# Patient Record
Sex: Male | Born: 1965 | Race: Black or African American | Hispanic: No | Marital: Married | Smoking: Never smoker
Health system: Southern US, Community
[De-identification: ages and names within clinical notes are randomized; demographics above are authoritative.]

## PROBLEM LIST (undated history)

## (undated) DIAGNOSIS — G4730 Sleep apnea, unspecified: Secondary | ICD-10-CM

## (undated) DIAGNOSIS — E7800 Pure hypercholesterolemia, unspecified: Secondary | ICD-10-CM

## (undated) DIAGNOSIS — E119 Type 2 diabetes mellitus without complications: Secondary | ICD-10-CM

## (undated) DIAGNOSIS — I10 Essential (primary) hypertension: Secondary | ICD-10-CM

## (undated) HISTORY — DX: Essential (primary) hypertension: I10

## (undated) HISTORY — DX: Pure hypercholesterolemia, unspecified: E78.00

## (undated) HISTORY — DX: Type 2 diabetes mellitus without complications: E11.9

---

## 2006-07-17 ENCOUNTER — Ambulatory Visit (HOSPITAL_COMMUNITY): Admission: RE | Admit: 2006-07-17 | Discharge: 2006-07-17 | Payer: Self-pay | Admitting: Family Medicine

## 2007-11-01 ENCOUNTER — Ambulatory Visit (HOSPITAL_COMMUNITY): Admission: RE | Admit: 2007-11-01 | Discharge: 2007-11-01 | Payer: Self-pay | Admitting: Family Medicine

## 2015-05-18 ENCOUNTER — Encounter (INDEPENDENT_AMBULATORY_CARE_PROVIDER_SITE_OTHER): Payer: Self-pay | Admitting: *Deleted

## 2015-06-24 ENCOUNTER — Other Ambulatory Visit (HOSPITAL_COMMUNITY): Payer: Self-pay | Admitting: Family Medicine

## 2015-06-24 ENCOUNTER — Ambulatory Visit (HOSPITAL_COMMUNITY)
Admission: RE | Admit: 2015-06-24 | Discharge: 2015-06-24 | Disposition: A | Discharge status: Home / Self Care | Payer: 59 | Source: Ambulatory Visit | Attending: Family Medicine | Admitting: Family Medicine

## 2015-06-24 DIAGNOSIS — M25571 Pain in right ankle and joints of right foot: Secondary | ICD-10-CM

## 2015-06-24 IMAGING — DX DG ANKLE COMPLETE 3+V*R*
3 series · 3 of 3 positions shown · non-contrast
Comparison: None.

CLINICAL DATA: Pain and swelling in the right ankle for 1 month.
Treated for gout. No reported injury.

EXAM:
RIGHT ANKLE - COMPLETE 3+ VIEW

[ankle ap]
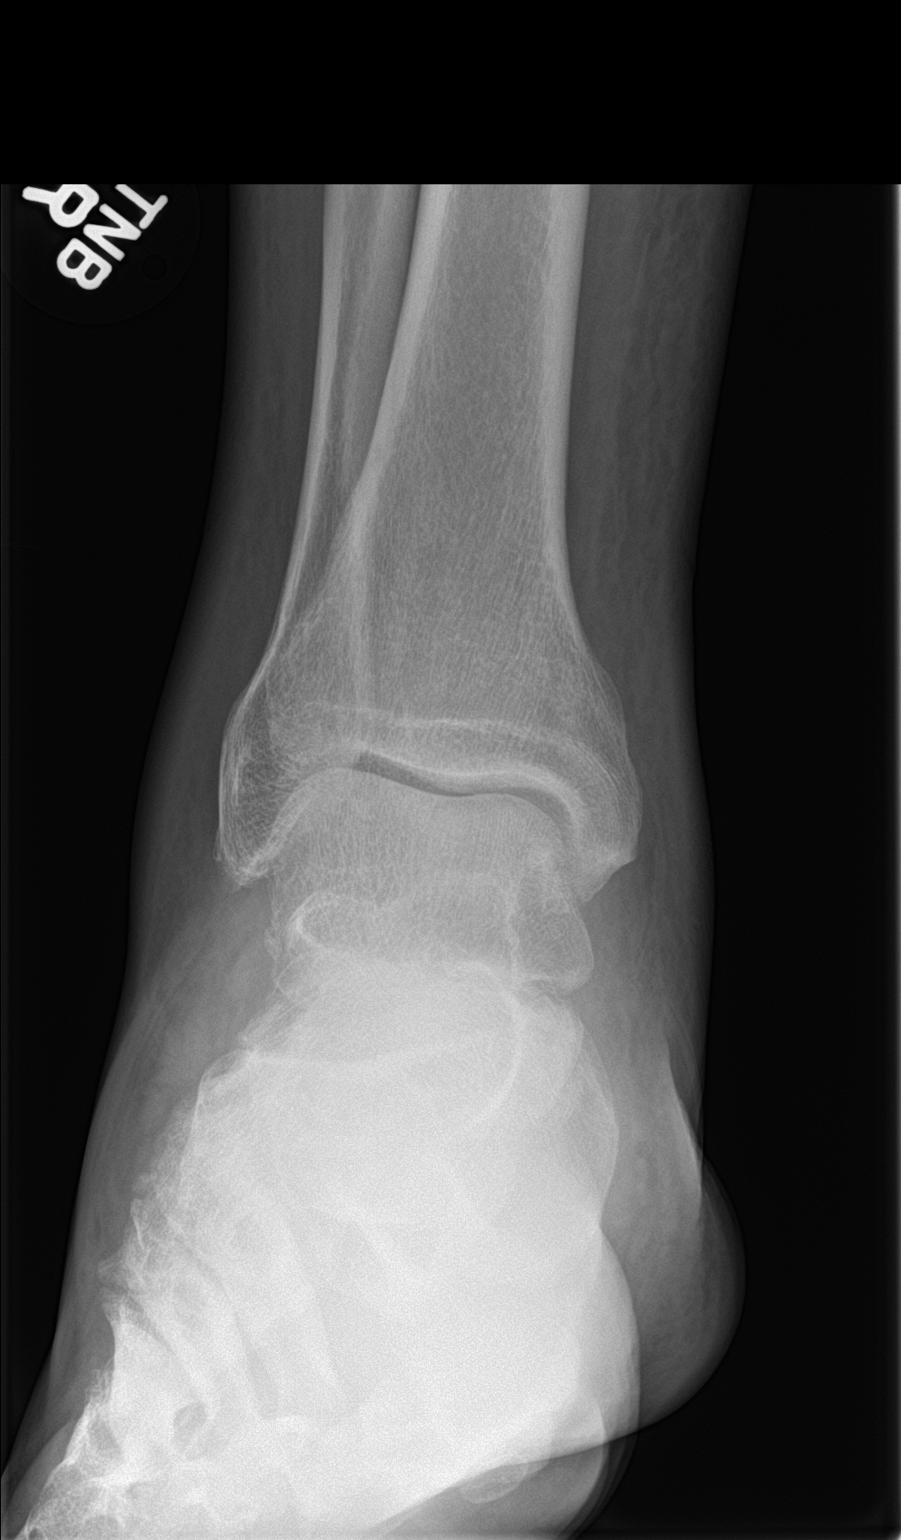

[ankle obl]
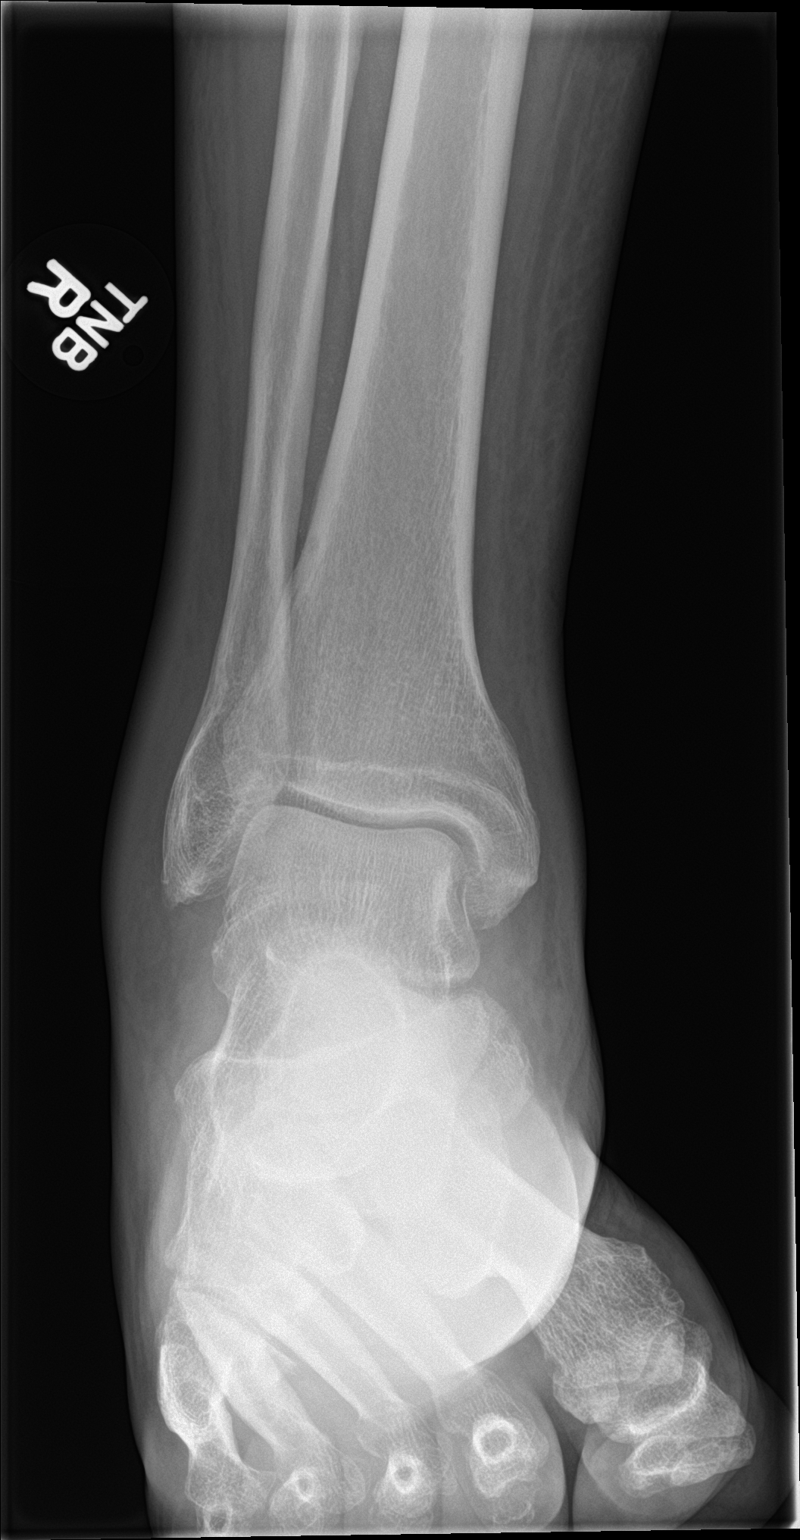

[ankle lat]
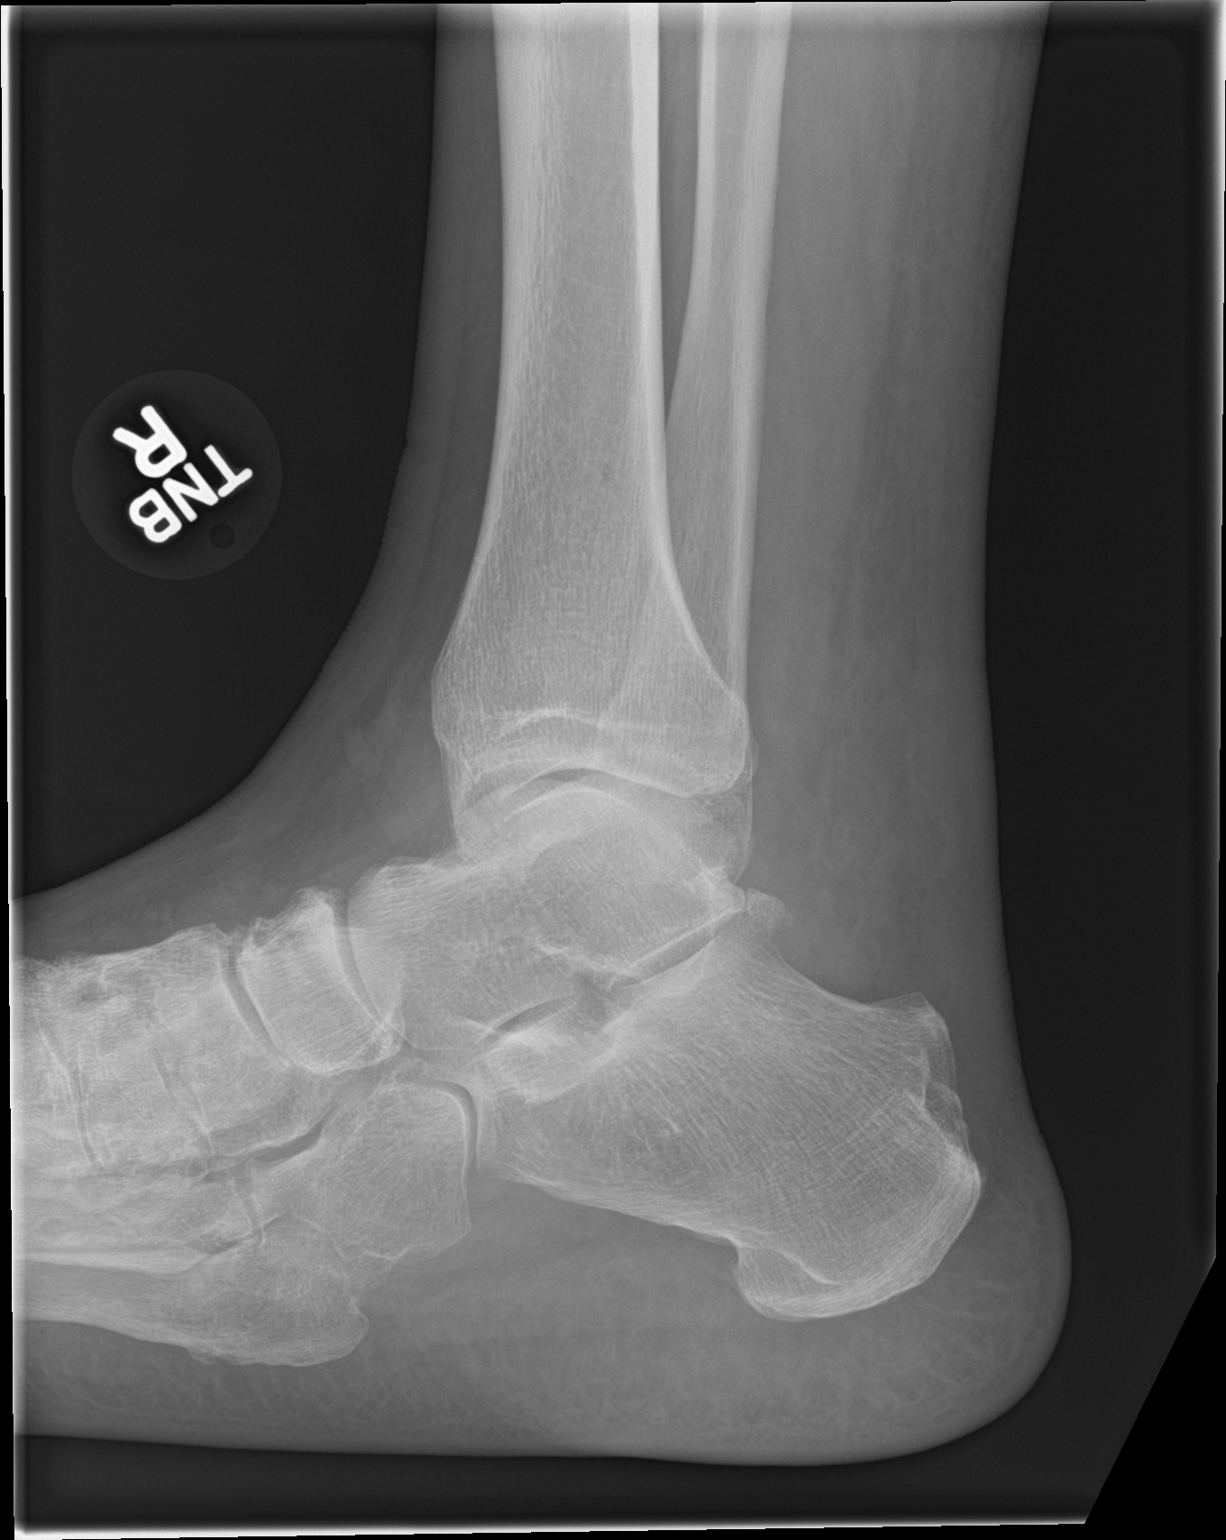

[3 of 3 positions shown; findings below may reference images not displayed]

FINDINGS: Moderate diffuse soft tissue swelling. No fracture, subluxation,
cortical erosions or suspicious focal osseous lesion. No appreciable
degenerative or erosive arthropathy. No pathologic soft tissue
calcifications.
IMPRESSION: Diffuse right ankle soft tissue swelling. No tophaceous soft tissue
calcifications. No cortical erosions in the right ankle.

## 2016-09-05 DIAGNOSIS — M109 Gout, unspecified: Secondary | ICD-10-CM | POA: Diagnosis not present

## 2016-09-05 DIAGNOSIS — E1165 Type 2 diabetes mellitus with hyperglycemia: Secondary | ICD-10-CM | POA: Diagnosis not present

## 2016-09-05 DIAGNOSIS — Z1389 Encounter for screening for other disorder: Secondary | ICD-10-CM | POA: Diagnosis not present

## 2016-09-21 DIAGNOSIS — E1165 Type 2 diabetes mellitus with hyperglycemia: Secondary | ICD-10-CM | POA: Diagnosis not present

## 2016-09-21 DIAGNOSIS — E782 Mixed hyperlipidemia: Secondary | ICD-10-CM | POA: Diagnosis not present

## 2016-09-21 DIAGNOSIS — I10 Essential (primary) hypertension: Secondary | ICD-10-CM | POA: Diagnosis not present

## 2016-12-12 DIAGNOSIS — E1165 Type 2 diabetes mellitus with hyperglycemia: Secondary | ICD-10-CM | POA: Diagnosis not present

## 2016-12-12 DIAGNOSIS — E782 Mixed hyperlipidemia: Secondary | ICD-10-CM | POA: Diagnosis not present

## 2016-12-26 DIAGNOSIS — R05 Cough: Secondary | ICD-10-CM | POA: Diagnosis not present

## 2017-10-05 DIAGNOSIS — E782 Mixed hyperlipidemia: Secondary | ICD-10-CM | POA: Diagnosis not present

## 2017-10-05 DIAGNOSIS — E1165 Type 2 diabetes mellitus with hyperglycemia: Secondary | ICD-10-CM | POA: Diagnosis not present

## 2017-10-05 DIAGNOSIS — M109 Gout, unspecified: Secondary | ICD-10-CM | POA: Diagnosis not present

## 2017-10-05 DIAGNOSIS — Z1389 Encounter for screening for other disorder: Secondary | ICD-10-CM | POA: Diagnosis not present

## 2018-01-16 DIAGNOSIS — E782 Mixed hyperlipidemia: Secondary | ICD-10-CM | POA: Diagnosis not present

## 2018-01-16 DIAGNOSIS — E119 Type 2 diabetes mellitus without complications: Secondary | ICD-10-CM | POA: Diagnosis not present

## 2018-01-16 DIAGNOSIS — R7989 Other specified abnormal findings of blood chemistry: Secondary | ICD-10-CM | POA: Diagnosis not present

## 2018-01-16 DIAGNOSIS — M109 Gout, unspecified: Secondary | ICD-10-CM | POA: Diagnosis not present

## 2019-05-04 ENCOUNTER — Ambulatory Visit: Payer: Self-pay | Attending: Internal Medicine

## 2019-05-04 DIAGNOSIS — Z23 Encounter for immunization: Secondary | ICD-10-CM

## 2019-05-04 NOTE — Progress Notes (Signed)
   Covid-19 Vaccination Clinic  Name:  Jesus Trevino    MRN: [id number] DOB: 03-Aug-1965  05/04/2019  [name] was observed post Covid-19 immunization for 15 minutes without incident. He was provided with Vaccine Information Sheet and instruction to access the V-Safe system.   [name] was instructed to call 911 with any severe reactions post vaccine: Marland Kitchen Difficulty breathing  . Swelling of face and throat  . A fast heartbeat  . A bad rash all over body  . Dizziness and weakness   Immunizations Administered    Name Date Dose VIS Date Route   Pfizer COVID-19 Vaccine 05/04/2019  6:10 PM 0.3 mL 02/07/2019 Intramuscular   Manufacturer: [place]   Lot: [id number]   [place]: [id number]

## 2019-05-25 ENCOUNTER — Ambulatory Visit: Payer: Self-pay | Attending: Internal Medicine

## 2019-05-25 DIAGNOSIS — Z23 Encounter for immunization: Secondary | ICD-10-CM

## 2019-05-25 NOTE — Progress Notes (Signed)
   Covid-19 Vaccination Clinic  Name:  Jesus Trevino    MRN: [id number] DOB: 11-28-1965  05/25/2019  Mr. [name] was observed post Covid-19 immunization for 15 minutes without incident. He was provided with Vaccine Information Sheet and instruction to access the V-Safe system.   Mr. [name] was instructed to call 911 with any severe reactions post vaccine: Marland Kitchen Difficulty breathing  . Swelling of face and throat  . A fast heartbeat  . A bad rash all over body  . Dizziness and weakness   Immunizations Administered    Name Date Dose VIS Date Route   Pfizer COVID-19 Vaccine 05/25/2019  1:30 PM 0.3 mL 02/07/2019 Intramuscular   Manufacturer: [place]   Lot: [id number]   [place]: [id number]

## 2020-04-27 ENCOUNTER — Encounter: Payer: Self-pay | Admitting: *Deleted

## 2020-05-26 ENCOUNTER — Encounter: Payer: Self-pay | Admitting: Nurse Practitioner

## 2020-05-26 ENCOUNTER — Other Ambulatory Visit: Payer: Self-pay

## 2020-05-26 ENCOUNTER — Ambulatory Visit (INDEPENDENT_AMBULATORY_CARE_PROVIDER_SITE_OTHER): Payer: Self-pay | Admitting: *Deleted

## 2020-05-26 VITALS — Ht 73.0 in | Wt 252.0 lb

## 2020-05-26 DIAGNOSIS — Z1211 Encounter for screening for malignant neoplasm of colon: Secondary | ICD-10-CM

## 2020-05-26 MED ORDER — CLENPIQ 10-3.5-12 MG-GM -GM/160ML PO SOLN: 1.0000 | Freq: Once | ORAL | 0 refills | Status: AC

## 2020-05-26 NOTE — Patient Instructions (Signed)
Covid test: 06/30/2020 at 3:40 PM.  [place]   Patient Name:  Jesus Trevino Date of procedure: 07/02/2020 Time to register at [place] Stay: You will receive a call from the hospital a few days before your procedure. Provider:  Dr. Abbey Chatters  Please notify us immediately if you are diabetic, take iron supplements, or if you are on coumadin or any blood thinners.  Please hold the following medications: See letter.  Note: Do NOT refrigerate or freeze CLENPIQ. CLENPIQ is ready to drink. There is no need to add any other liquid or mix the medicine in the bottle before you start dosing.   07/01/2020-  1 Day prior to procedure:     CLEAR LIQUIDS ALL DAY--NO SOLID FOODS OR DAIRY PRODUCTS! See list of liquids that are allowed and items that are NOT allowed below.   Diabetic Medication Instructions:  See letter.   You must drink plenty of CLEAR LIQUIDS starting before your bowel prep. It is important to stay adequately hydrated before, during, and after your bowel prep for the prep to work effectively!   At 4:00 PM Begin the prep as follows:    1. Drink one bottle of pre-mixed CLENPIQ right from the bottle.  2. Drink at least five (5) 8-ounce drinks of clear liquids of your choice within the next 5 hours   At 10:00 PM: 1. Drink the second bottle of pre-mixed CLENPIQ right from the bottle.   2. Drink at least three (3) 8-ounce drinks of clear liquids of your choice within the next 3 hours before going to bed.   Continue clear liquids until midnight.  Nothing by mouth after midnight.  07/02/2020-  Day of Procedure:   Diabetic medications adjustments: See letter.   You may take TYLENOL products.  Please continue your regular medications unless we have instructed you otherwise.     Please note, on the day of your procedure you MUST be accompanied by an adult who is willing to assume responsibility for you at time of discharge. If you do not have such person with you,  your procedure will have to be rescheduled.                                                                                                                     Please leave ALL jewelry at home prior to coming to the hospital for your procedure.   *It is your responsibility to check with your insurance company for the benefits of coverage you have for this procedure. Unfortunately, not all insurance companies have benefits to cover all or part of these types of procedures. It is your responsibility to check your benefits, however we will be glad to assist you with any codes your insurance company may need.   Please note that most insurance companies will not cover a screening colonoscopy for people under the age of 56  For example, with some insurance companies you may have benefits for a screening colonoscopy, but if polyps  are found the diagnosis will change and then you may have a deductible that will need to be met. Please make sure you check your benefits for screening colonoscopy as well as a diagnostic colonoscopy.    CLEAR LIQUIDS: (NO RED or PURPLE) Water  Jello   Apple Juice  White Grape Juice   Kool-Aid Soft drinks  Banana popsicles Sports Drink  Black coffee (No cream or milk) Tea (No cream or milk)  Broth (fat free beef/chicken/vegetable)  Clear liquids allow you to see your fingers on the other side of the glass.  Be sure they are NOT RED or PURPLE in color, cloudy, but CLEAR.  Do Not Eat: Dairy products of any kind Cranberry juice Tomato or V8 Juice  Orange Juice   Grapefruit Juice Red Grape Juice Alcohol   Non-dairy creamer Solid foods like cereal, oatmeal, yogurt, fruits, vegetables, creamed soups, eggs, bread, etc   HELPFUL HINTS TO MAKE DRINKING EASIER: -Trying drinking through a straw. -If you become nauseated, try consuming smaller amounts or stretch out the time between glasses.  Stop for 30 minutes & slowly start back drinking.  Call our office with any questions  or concerns at [phone].  Thank You,  Christ Kick, [place]

## 2020-05-26 NOTE — Progress Notes (Signed)
Gastroenterology Pre-Procedure Review  Request Date: 05/26/2020 Requesting Physician: Dr. Hilma Favors @ [facility], no previous TCS, family hx of colon polyps (mother)  PATIENT REVIEW QUESTIONS: The patient responded to the following health history questions as indicated:    1. Diabetes Melitis: yes, type II 2. Joint replacements in the past 12 months: no 3. Major health problems in the past 3 months: no 4. Has an artificial valve or MVP: no 5. Has a defibrillator: no 6. Has been advised in past to take antibiotics in advance of a procedure like teeth cleaning: no 7. Family history of colon cancer: no, but mother had colon polyps  8. Alcohol Use: no 9. Illicit drug Use: no 10. History of sleep apnea: yes, but not on CPAP  11. History of coronary artery or other vascular stents placed within the last 12 months: no 12. History of any prior anesthesia complications: no 13. Body mass index is 33.25 kg/m.    MEDICATIONS & ALLERGIES:    Patient reports the following regarding taking any blood thinners:   Plavix? no Aspirin? no Coumadin? no Brilinta? no Xarelto? no Eliquis? no Pradaxa? no Savaysa? no Effient? no  Patient confirms/reports the following medications:  Current Outpatient Medications  Medication Sig Dispense Refill  . metFORMIN (GLUCOPHAGE) 1000 MG tablet daily.    Marland Kitchen olmesartan (BENICAR) 40 MG tablet Take by mouth daily. Takes 1/2 tablet once daily.    . rosuvastatin (CRESTOR) 5 MG tablet Take 5 mg by mouth daily.     No current facility-administered medications for this visit.    Patient confirms/reports the following allergies:  No Known Allergies  No orders of the defined types were placed in this encounter.   AUTHORIZATION INFORMATION Primary Insurance: [facility],  [place] #:[id number] ,  Group #: [id number] Pre-Cert / Josem Kaufmann required: No, not required  SCHEDULE INFORMATION: Procedure has been scheduled as follows:  Date: 07/02/2020, Time: AM  procedure Location: [facility] with Dr. Abbey Chatters  This Gastroenterology Pre-Precedure Review Form is being routed to the following provider(s): Walden Field, NP

## 2020-05-26 NOTE — Progress Notes (Addendum)
Pt called me back when he got home to confirm his medications.  Added Glimepiride 2 mg x twice daily.

## 2020-05-26 NOTE — Progress Notes (Signed)
Ok to schedule.  DM meds: Glucophage: None the morning of  On prep day: Check CBG ac and hs as well (if they normally check their blood sugar) as if the patient feels like their blood sugar is off. Can use soda, juice (that's in [place]) as needed for any low blood sugar.  Check CBG on arrival to endo unit.  ASA I/II

## 2020-05-27 ENCOUNTER — Encounter: Payer: Self-pay | Admitting: *Deleted

## 2020-05-27 NOTE — Progress Notes (Signed)
Mailed letter to pt with diabetes medication adjustments.   

## 2020-06-30 ENCOUNTER — Other Ambulatory Visit: Payer: Self-pay

## 2020-06-30 ENCOUNTER — Other Ambulatory Visit (HOSPITAL_COMMUNITY)
Admission: RE | Admit: 2020-06-30 | Discharge: 2020-06-30 | Disposition: A | Discharge status: Home / Self Care | Payer: BC Managed Care – PPO | Source: Ambulatory Visit | Attending: Internal Medicine | Admitting: Internal Medicine

## 2020-06-30 DIAGNOSIS — D124 Benign neoplasm of descending colon: Secondary | ICD-10-CM | POA: Diagnosis not present

## 2020-06-30 DIAGNOSIS — Z79899 Other long term (current) drug therapy: Secondary | ICD-10-CM | POA: Diagnosis not present

## 2020-06-30 DIAGNOSIS — Z01812 Encounter for preprocedural laboratory examination: Secondary | ICD-10-CM | POA: Insufficient documentation

## 2020-06-30 DIAGNOSIS — Z7984 Long term (current) use of oral hypoglycemic drugs: Secondary | ICD-10-CM | POA: Diagnosis not present

## 2020-06-30 DIAGNOSIS — D123 Benign neoplasm of transverse colon: Secondary | ICD-10-CM | POA: Diagnosis not present

## 2020-06-30 DIAGNOSIS — Z20822 Contact with and (suspected) exposure to covid-19: Secondary | ICD-10-CM | POA: Insufficient documentation

## 2020-06-30 DIAGNOSIS — K648 Other hemorrhoids: Secondary | ICD-10-CM | POA: Diagnosis not present

## 2020-06-30 DIAGNOSIS — Z1211 Encounter for screening for malignant neoplasm of colon: Secondary | ICD-10-CM | POA: Diagnosis not present

## 2020-07-01 LAB — SARS CORONAVIRUS 2 (TAT 6-24 HRS): SARS Coronavirus 2: NEGATIVE

## 2020-07-02 ENCOUNTER — Other Ambulatory Visit: Payer: Self-pay

## 2020-07-02 ENCOUNTER — Encounter (HOSPITAL_COMMUNITY)
Admission: RE | Disposition: A | Discharge status: Home / Self Care | Payer: Self-pay | Source: Home / Self Care | Attending: Internal Medicine

## 2020-07-02 ENCOUNTER — Encounter (HOSPITAL_COMMUNITY): Payer: Self-pay

## 2020-07-02 ENCOUNTER — Ambulatory Visit (HOSPITAL_COMMUNITY)
Admission: RE | Admit: 2020-07-02 | Discharge: 2020-07-02 | Disposition: A | Discharge status: Home / Self Care | Payer: BC Managed Care – PPO | Attending: Internal Medicine | Admitting: Internal Medicine

## 2020-07-02 ENCOUNTER — Ambulatory Visit (HOSPITAL_COMMUNITY): Payer: BC Managed Care – PPO | Admitting: Anesthesiology

## 2020-07-02 DIAGNOSIS — Z1211 Encounter for screening for malignant neoplasm of colon: Secondary | ICD-10-CM | POA: Insufficient documentation

## 2020-07-02 DIAGNOSIS — K648 Other hemorrhoids: Secondary | ICD-10-CM | POA: Insufficient documentation

## 2020-07-02 DIAGNOSIS — K635 Polyp of colon: Secondary | ICD-10-CM | POA: Diagnosis not present

## 2020-07-02 DIAGNOSIS — D123 Benign neoplasm of transverse colon: Secondary | ICD-10-CM | POA: Insufficient documentation

## 2020-07-02 DIAGNOSIS — Z7984 Long term (current) use of oral hypoglycemic drugs: Secondary | ICD-10-CM | POA: Insufficient documentation

## 2020-07-02 DIAGNOSIS — D124 Benign neoplasm of descending colon: Secondary | ICD-10-CM | POA: Insufficient documentation

## 2020-07-02 DIAGNOSIS — Z20822 Contact with and (suspected) exposure to covid-19: Secondary | ICD-10-CM | POA: Insufficient documentation

## 2020-07-02 DIAGNOSIS — Z79899 Other long term (current) drug therapy: Secondary | ICD-10-CM | POA: Insufficient documentation

## 2020-07-02 HISTORY — PX: COLONOSCOPY WITH PROPOFOL: SHX5780

## 2020-07-02 HISTORY — PX: POLYPECTOMY: SHX5525

## 2020-07-02 LAB — GLUCOSE, CAPILLARY: Glucose-Capillary: 123 mg/dL — ABNORMAL HIGH (ref 70–99)

## 2020-07-02 SURGERY — COLONOSCOPY WITH PROPOFOL
Anesthesia: General

## 2020-07-02 MED ORDER — LIDOCAINE HCL (CARDIAC) PF 100 MG/5ML IV SOSY
PREFILLED_SYRINGE | INTRAVENOUS | Status: DC | PRN
  Administered 2020-07-02: 50 mg via INTRAVENOUS

## 2020-07-02 MED ORDER — CHLORHEXIDINE GLUCONATE CLOTH 2 % EX PADS: 6.0000 | MEDICATED_PAD | Freq: Once | CUTANEOUS | Status: DC

## 2020-07-02 MED ORDER — PROPOFOL 10 MG/ML IV BOLUS
INTRAVENOUS | Status: DC | PRN
  Administered 2020-07-02: 130 mg via INTRAVENOUS
  Administered 2020-07-02: 40 mg via INTRAVENOUS
  Administered 2020-07-02: 50 mg via INTRAVENOUS
  Administered 2020-07-02: 30 mg via INTRAVENOUS

## 2020-07-02 MED ORDER — LACTATED RINGERS IV SOLN: INTRAVENOUS | Status: DC

## 2020-07-02 NOTE — Op Note (Signed)
[facility] Patient Name: Jesus Trevino Procedure Date: 07/02/2020 9:11 AM MRN: [id number] Date of Birth: 08-09-1965 Attending MD: Elon Alas. Abbey Chatters DO CSN: [id number] Age: 55 Admit Type: Outpatient Procedure:                Colonoscopy Indications:              Screening for colorectal malignant neoplasm Providers:                Elon Alas. Abbey Chatters, DO, Gwenlyn Fudge, RN, Aram Candela Referring MD:              Medicines:                See the Anesthesia note for documentation of the                            administered medications Complications:            No immediate complications. Estimated Blood Loss:     Estimated blood loss was minimal. Procedure:                Pre-Anesthesia Assessment:                           - The anesthesia plan was to use monitored                            anesthesia care (MAC).                           After obtaining informed consent, the colonoscope                            was passed under direct vision. Throughout the                            procedure, the patient's blood pressure, pulse, and                            oxygen saturations were monitored continuously. The                            [id number]-HQ190L ([id number]) scope was introduced through                            the anus and advanced to the the cecum, identified                            by appendiceal orifice and ileocecal valve. The                            colonoscopy was performed without difficulty. The                            patient tolerated the procedure well. The quality  of the bowel preparation was evaluated using the                            BBPS [facility] Bowel Preparation Scale) with scores                            of: Right Colon = 3, Transverse Colon = 3 and Left                            Colon = 3 (entire mucosa seen well with no residual                            staining, small fragments  of stool or opaque                            liquid). The total BBPS score equals 9. Scope In: 9:22:02 AM Scope Out: 9:34:34 AM Scope Withdrawal Time: 0 hours 9 minutes 39 seconds  Total Procedure Duration: 0 hours 12 minutes 32 seconds  Findings:      The perianal and digital rectal examinations were normal.      Non-bleeding internal hemorrhoids were found during endoscopy.      A 6 mm polyp was found in the transverse colon. The polyp was sessile.       The polyp was removed with a cold snare. Resection and retrieval were       complete.      A 5 mm polyp was found in the descending colon. The polyp was sessile.       The polyp was removed with a hot snare. Resection and retrieval were       complete.      The exam was otherwise without abnormality. Impression:               - Non-bleeding internal hemorrhoids.                           - One 6 mm polyp in the transverse colon, removed                            with a cold snare. Resected and retrieved.                           - One 5 mm polyp in the descending colon, removed                            with a hot snare. Resected and retrieved.                           - The examination was otherwise normal. Moderate Sedation:      Per Anesthesia Care Recommendation:           - Patient has a contact number available for                            emergencies. The signs and symptoms of potential  delayed complications were discussed with the                            patient. Return to normal activities tomorrow.                            Written discharge instructions were provided to the                            patient.                           - Resume previous diet.                           - Continue present medications.                           - Await pathology results.                           - Repeat colonoscopy in 5 years for surveillance.                           - Return to GI  clinic PRN. Procedure Code(s):        --- Professional ---                           [phone], Colonoscopy, flexible; with removal of                            tumor(s), polyp(s), or other lesion(s) by snare                            technique Diagnosis Code(s):        --- Professional ---                           Z12.11, Encounter for screening for malignant                            neoplasm of colon                           K63.5, Polyp of colon                           K64.8, Other hemorrhoids CPT copyright 2019 American Medical Association. All rights reserved. The codes documented in this report are preliminary and upon coder review may  be revised to meet current compliance requirements. Elon Alas. Abbey Chatters, DO [place] Abbey Chatters, DO 07/02/2020 9:37:08 AM This report has been signed electronically. Number of Addenda: 0

## 2020-07-02 NOTE — Anesthesia Preprocedure Evaluation (Addendum)
Anesthesia Evaluation  Patient identified by MRN, date of birth, ID band Patient awake    Reviewed: Allergy & Precautions, NPO status , Patient's Chart, lab work & pertinent test results  History of Anesthesia Complications Negative for: history of anesthetic complications  Airway Mallampati: II  TM Distance: >3 FB Neck ROM: Full    Dental  (+) Dental Advisory Given, Chipped,    Pulmonary neg pulmonary ROS,    Pulmonary exam normal breath sounds clear to auscultation       Cardiovascular Exercise Tolerance: Good hypertension, Pt. on medications Normal cardiovascular exam Rhythm:Regular Rate:Normal     Neuro/Psych negative neurological ROS     GI/Hepatic negative GI ROS, Neg liver ROS,   Endo/Other  diabetes, Well Controlled, Type 2, Oral Hypoglycemic Agents  Renal/GU negative Renal ROS     Musculoskeletal negative musculoskeletal ROS (+)   Abdominal (+)  Abdomen: tender.  Tender umbilical hernia  Peds  Hematology negative hematology ROS (+)   Anesthesia Other Findings   Reproductive/Obstetrics negative OB ROS                           Anesthesia Physical Anesthesia Plan  ASA: II  Anesthesia Plan: General   Post-op Pain Management:    Induction: Intravenous  PONV Risk Score and Plan: Propofol infusion  Airway Management Planned: Nasal Cannula and Natural Airway  Additional Equipment:   Intra-op Plan:   Post-operative Plan:   Informed Consent: I have reviewed the patients History and Physical, chart, labs and discussed the procedure including the risks, benefits and alternatives for the proposed anesthesia with the patient or authorized representative who has indicated his/her understanding and acceptance.     Dental advisory given  Plan Discussed with: CRNA and Surgeon  Anesthesia Plan Comments:         Anesthesia Quick Evaluation

## 2020-07-02 NOTE — Transfer of Care (Signed)
Immediate Anesthesia Transfer of Care Note  Patient: Jesus Trevino  Procedure(s) Performed: COLONOSCOPY WITH PROPOFOL (N/A ) POLYPECTOMY  Patient Location: Endoscopy Unit  Anesthesia Type:General  Level of Consciousness: awake  Airway & Oxygen Therapy: Patient Spontanous Breathing  Post-op Assessment: Report given to RN and Post -op Vital signs reviewed and stable  Post vital signs: Reviewed and stable  Last Vitals:  Vitals Value Taken Time  BP 132/74 07/02/20 0937  Temp 36.9 C 07/02/20 0937  Pulse    Resp 13 07/02/20 0937  SpO2 98 % 07/02/20 0937    Last Pain:  Vitals:   07/02/20 0937  TempSrc: Oral  PainSc: 0-No pain      Patients Stated Pain Goal: 7 (35/70/17 7939)  Complications: No complications documented.

## 2020-07-02 NOTE — Anesthesia Procedure Notes (Signed)
Date/Time: 07/02/2020 9:24 AM Performed by: Orlie Dakin, CRNA Pre-anesthesia Checklist: Patient identified, Emergency Drugs available, Suction available and Patient being monitored Patient Re-evaluated:Patient Re-evaluated prior to induction Oxygen Delivery Method: Nasal cannula Induction Type: IV induction Placement Confirmation: positive ETCO2

## 2020-07-02 NOTE — H&P (Signed)
Primary Care Physician:  Sharilyn Sites, MD Primary Gastroenterologist:  Dr. Abbey Chatters  Pre-Procedure History & Physical: HPI:  Jesus Trevino is a 55 y.o. male is here for a colonoscopy for colon cancer screening purposes.  Patient denies any family history of colorectal cancer.  No melena or hematochezia.  No abdominal pain or unintentional weight loss.  No change in bowel habits.  Overall feels well from a GI standpoint.  Past Medical History:  Diagnosis Date  . Diabetes ([place])   . Hypercholesterolemia   . Hypertension     History reviewed. No pertinent surgical history.  Prior to Admission medications   Medication Sig Start Date End Date Taking? Authorizing Provider  glimepiride (AMARYL) 2 MG tablet Take 2 mg by mouth 2 (two) times daily. 04/20/20  Yes [provider]  metFORMIN (GLUCOPHAGE) 1000 MG tablet Take 1,000 mg by mouth daily. 04/20/20  Yes [provider]  olmesartan (BENICAR) 40 MG tablet Take 20 mg by mouth daily. Takes 1/2 tablet once daily. 04/10/20  Yes [provider]  rosuvastatin (CRESTOR) 5 MG tablet Take 5 mg by mouth daily. 04/10/20  Yes [provider]    Allergies as of 05/27/2020  . (No Known Allergies)    History reviewed. No pertinent family history.  Social History   Socioeconomic History  . Marital status: Married    Spouse name: Not on file  . Number of children: Not on file  . Years of education: Not on file  . Highest education level: Not on file  Occupational History  . Not on file  Tobacco Use  . Smoking status: Never Smoker  . Smokeless tobacco: Never Used  Vaping Use  . Vaping Use: Never used  Substance and Sexual Activity  . Alcohol use: Never  . Drug use: Never  . Sexual activity: Not on file  Other Topics Concern  . Not on file  Social History Narrative  . Not on file   Social Determinants of Health   Financial Resource Strain: Not on file  Food Insecurity: Not on file  Transportation  Needs: Not on file  Physical Activity: Not on file  Stress: Not on file  Social Connections: Not on file  Intimate Partner Violence: Not on file    Review of Systems: See HPI, otherwise negative ROS  Physical Exam: Vital signs in last 24 hours: Temp:  [98.5 F (36.9 C)] 98.5 F (36.9 C) (05/06 0757) Pulse Rate:  [111] 111 (05/06 0757) Resp:  [14] 14 (05/06 0757) BP: (131-196)/(98-118) 179/98 (05/06 0808) SpO2:  [97 %] 97 % (05/06 0757) Weight:  [114.3 kg] 114.3 kg (05/06 0748)   General:   Alert,  Well-developed, well-nourished, pleasant and cooperative in NAD Head:  Normocephalic and atraumatic. Eyes:  Sclera clear, no icterus.   Conjunctiva pink. Ears:  Normal auditory acuity. Nose:  No deformity, discharge,  or lesions. Mouth:  No deformity or lesions, dentition normal. Neck:  Supple; no masses or thyromegaly. Lungs:  Clear throughout to auscultation.   No wheezes, crackles, or rhonchi. No acute distress. Heart:  Regular rate and rhythm; no murmurs, clicks, rubs,  or gallops. Abdomen:  Soft, nontender and nondistended. No masses, hepatosplenomegaly or hernias noted. Normal bowel sounds, without guarding, and without rebound.   Msk:  Symmetrical without gross deformities. Normal posture. Extremities:  Without clubbing or edema. Neurologic:  Alert and  oriented x4;  grossly normal neurologically. Skin:  Intact without significant lesions or rashes. Cervical Nodes:  No significant cervical adenopathy.  Psych:  Alert and cooperative. Normal mood and affect.  Impression/Plan: Jesus Trevino is here for a colonoscopy to be performed for colon cancer screening purposes.  The risks of the procedure including infection, bleed, or perforation as well as benefits, limitations, alternatives and imponderables have been reviewed with the patient. Questions have been answered. All parties agreeable.

## 2020-07-02 NOTE — Anesthesia Postprocedure Evaluation (Signed)
Anesthesia Post Note  Patient: Jesus Trevino  Procedure(s) Performed: COLONOSCOPY WITH PROPOFOL (N/A ) POLYPECTOMY  Patient location during evaluation: PACU Anesthesia Type: General Level of consciousness: awake and alert and oriented Pain management: pain level controlled Vital Signs Assessment: post-procedure vital signs reviewed and stable Respiratory status: spontaneous breathing and respiratory function stable Cardiovascular status: blood pressure returned to baseline and stable Postop Assessment: no apparent nausea or vomiting Anesthetic complications: no   No complications documented.   Last Vitals:  Vitals:   07/02/20 0808 07/02/20 0937  BP: (!) 179/98 132/74  Pulse:    Resp:  13  Temp:  36.9 C  SpO2:  98%    Last Pain:  Vitals:   07/02/20 0937  TempSrc: Oral  PainSc: 0-No pain                 Joclynn Lumb C Dniya Neuhaus

## 2020-07-02 NOTE — Discharge Instructions (Addendum)
Colonoscopy Discharge Instructions  Read the instructions outlined below and refer to this sheet in the next few weeks. These discharge instructions provide you with general information on caring for yourself after you leave the hospital. Your doctor may also give you specific instructions. While your treatment has been planned according to the most current medical practices available, unavoidable complications occasionally occur.   ACTIVITY  You may resume your regular activity, but move at a slower pace for the next 24 hours.   Take frequent rest periods for the next 24 hours.   Walking will help get rid of the air and reduce the bloated feeling in your belly (abdomen).   No driving for 24 hours (because of the medicine (anesthesia) used during the test).    Do not sign any important legal documents or operate any machinery for 24 hours (because of the anesthesia used during the test).  NUTRITION  Drink plenty of fluids.   You may resume your normal diet as instructed by your doctor.   Begin with a light meal and progress to your normal diet. Heavy or fried foods are harder to digest and may make you feel sick to your stomach (nauseated).   Avoid alcoholic beverages for 24 hours or as instructed.  MEDICATIONS  You may resume your normal medications unless your doctor tells you otherwise.  WHAT YOU CAN EXPECT TODAY  Some feelings of bloating in the abdomen.   Passage of more gas than usual.   Spotting of blood in your stool or on the toilet paper.  IF YOU HAD POLYPS REMOVED DURING THE COLONOSCOPY:  No aspirin products for 7 days or as instructed.   No alcohol for 7 days or as instructed.   Eat a soft diet for the next 24 hours.  FINDING OUT THE RESULTS OF YOUR TEST Not all test results are available during your visit. If your test results are not back during the visit, make an appointment with your caregiver to find out the results. Do not assume everything is normal if  you have not heard from your caregiver or the medical facility. It is important for you to follow up on all of your test results.  SEEK IMMEDIATE MEDICAL ATTENTION IF:  You have more than a spotting of blood in your stool.   Your belly is swollen (abdominal distention).   You are nauseated or vomiting.   You have a temperature over 101.   You have abdominal pain or discomfort that is severe or gets worse throughout the day.   Your colonoscopy revealed 2 polyp(s) which I removed successfully. Await pathology results, my office will contact you. I recommend repeating colonoscopy in 5 years for surveillance purposes. Otherwise follow up with GI as needed.   I hope you have a great rest of your week!  Charles K. Carver, D.O. Gastroenterology and Hepatology [facility]    Colon Polyps  Colon polyps are tissue growths inside the colon, which is part of the large intestine. They are one of the types of polyps that can grow in the body. A polyp may be a round bump or a mushroom-shaped growth. You could have one polyp or more than one. Most colon polyps are noncancerous (benign). However, some colon polyps can become cancerous over time. Finding and removing the polyps early can help prevent this. What are the causes? The exact cause of colon polyps is not known. What increases the risk? The following factors may make you more likely to   this condition:  Having a family history of colorectal cancer or colon polyps.  Being older than 56 years of age.  Being younger than 56 years of age and having a significant family history of colorectal cancer or colon polyps or a genetic condition that puts you at higher risk of getting colon polyps.  Having inflammatory bowel disease, such as ulcerative colitis or Crohn's disease.  Having certain conditions passed from parent to child (hereditary conditions), such as: ? Familial adenomatous polyposis (FAP). ? Lynch  syndrome. ? Turcot syndrome. ? Peutz-Jeghers syndrome. ? MUTYH-associated polyposis (MAP).  Being overweight.  Certain lifestyle factors. These include smoking cigarettes, drinking too much alcohol, not getting enough exercise, and eating a diet that is high in fat and red meat and low in fiber.  Having had childhood cancer that was treated with radiation of the abdomen. What are the signs or symptoms? Many times, there are no symptoms. If you have symptoms, they may include:  Blood coming from the rectum during a bowel movement.  Blood in the stool (feces). The blood may be bright red or very dark in color.  Pain in the abdomen.  A change in bowel habits, such as constipation or diarrhea. How is this diagnosed? This condition is diagnosed with a colonoscopy. This is a procedure in which a lighted, flexible scope is inserted into the opening between the buttocks (anus) and then passed into the colon to examine the area. Polyps are sometimes found when a colonoscopy is done as part of routine cancer screening tests. How is this treated? This condition is treated by removing any polyps that are found. Most polyps can be removed during a colonoscopy. Those polyps will then be tested for cancer. Additional treatment may be needed depending on the results of testing. Follow these instructions at home: Eating and drinking  Eat foods that are high in fiber, such as fruits, vegetables, and whole grains.  Eat foods that are high in calcium and vitamin D, such as milk, cheese, yogurt, eggs, liver, fish, and broccoli.  Limit foods that are high in fat, such as fried foods and desserts.  Limit the amount of red meat, precooked or cured meat, or other processed meat that you eat, such as hot dogs, sausages, bacon, or meat loaves.  Limit sugary drinks.   Lifestyle  Maintain a healthy weight, or lose weight if recommended by your health care provider.  Exercise every day or as told by your  health care provider.  Do not use any products that contain nicotine or tobacco, such as cigarettes, e-cigarettes, and chewing tobacco. If you need help quitting, ask your health care provider.  Do not drink alcohol if: ? Your health care provider tells you not to drink. ? You are pregnant, may be pregnant, or are planning to become pregnant.  If you drink alcohol: ? Limit how much you use to:  0-1 drink a day for women.  0-2 drinks a day for men. ? Know how much alcohol is in your drink. In the [place] one drink equals one 12 oz bottle of beer (355 mL), one 5 oz glass of wine (148 mL), or one 1 oz glass of hard liquor (44 mL). General instructions  Take over-the-counter and prescription medicines only as told by your health care provider.  Keep all follow-up visits. This is important. This includes having regularly scheduled colonoscopies. Talk to your health care provider about when you need a colonoscopy. Contact a health care provider if:  You have new or worsening bleeding during a bowel movement.  You have new or increased blood in your stool.  You have a change in bowel habits.  You lose weight for no known reason. Summary  Colon polyps are tissue growths inside the colon, which is part of the large intestine. They are one type of polyp that can grow in the body.  Most colon polyps are noncancerous (benign), but some can become cancerous over time.  This condition is diagnosed with a colonoscopy.  This condition is treated by removing any polyps that are found. Most polyps can be removed during a colonoscopy. This information is not intended to replace advice given to you by your health care provider. Make sure you discuss any questions you have with your health care provider. Document Revised: 06/04/2019 Document Reviewed: 06/04/2019 Elsevier Patient Education  2021 [employer].

## 2020-07-05 LAB — SURGICAL PATHOLOGY

## 2020-07-09 ENCOUNTER — Encounter (HOSPITAL_COMMUNITY): Payer: Self-pay | Admitting: Internal Medicine

## 2020-08-06 NOTE — Progress Notes (Signed)
Recall placed

## 2020-08-11 ENCOUNTER — Other Ambulatory Visit: Payer: Self-pay | Admitting: Family Medicine

## 2020-08-11 DIAGNOSIS — K429 Umbilical hernia without obstruction or gangrene: Secondary | ICD-10-CM

## 2020-08-11 NOTE — Progress Notes (Signed)
ref

## 2020-08-24 ENCOUNTER — Ambulatory Visit: Payer: BC Managed Care – PPO | Admitting: General Surgery

## 2020-08-24 ENCOUNTER — Other Ambulatory Visit: Payer: Self-pay

## 2020-08-24 ENCOUNTER — Encounter: Payer: Self-pay | Admitting: General Surgery

## 2020-08-24 VITALS — BP 161/89 | HR 66 | Temp 98.1°F | Ht 73.0 in | Wt 257.2 lb

## 2020-08-24 DIAGNOSIS — K420 Umbilical hernia with obstruction, without gangrene: Secondary | ICD-10-CM

## 2020-08-24 NOTE — Progress Notes (Signed)
[facility] History and Physical  Reason for Referral: Umbilical hernia  Referring Physician: Sharilyn Sites, MD   Chief Complaint   New Patient (Initial Visit)     Jesus Trevino is a 55 y.o. male.  HPI: Mr. Jesus Trevino is a 55 yo who had a umbilical hernia for 5-6 years. It has gotten larger and is tender to him. He was noted to have this during his recent colonoscopy and they recommended that he come and get evaluated for repair. He says that he has pain with pushing the hernia in and it is 10/10 pain. He denise any obstructive type symptoms, but says that it does not want anyone to push on the hernia.  He otherwise says it does not bother him. He denies any skin changes or redness.   Past Medical History:  Diagnosis Date   Diabetes ([place])    Hypercholesterolemia    Hypertension     Past Surgical History:  Procedure Laterality Date   COLONOSCOPY WITH PROPOFOL N/A 07/02/2020   Procedure: COLONOSCOPY WITH PROPOFOL;  Surgeon: Eloise Harman, DO;  Location: [facility];  Service: Endoscopy;  Laterality: N/A;  ASA I / II / AM procedure   POLYPECTOMY  07/02/2020   Procedure: POLYPECTOMY;  Surgeon: Eloise Harman, DO;  Location: [facility];  Service: Endoscopy;;    Family History  Problem Relation Age of Onset   Asthma Mother    Alcohol abuse Father    Diabetes Sister     Social History   Tobacco Use   Smoking status: Never   Smokeless tobacco: Never  Vaping Use   Vaping Use: Never used  Substance Use Topics   Alcohol use: Never   Drug use: Never    Medications: I have reviewed the patient's current medications. Allergies as of 08/24/2020   No Known Allergies      Medication List        Accurate as of August 24, 2020 11:59 PM. If you have any questions, ask your nurse or doctor.          glimepiride 2 MG tablet Commonly known as: AMARYL Take 2 mg by mouth 2 (two) times daily.   metFORMIN 1000 MG tablet Commonly known as:  GLUCOPHAGE Take 1,000 mg by mouth daily.   olmesartan 40 MG tablet Commonly known as: BENICAR Take 20 mg by mouth daily. Takes 1/2 tablet once daily.   rosuvastatin 5 MG tablet Commonly known as: CRESTOR Take 5 mg by mouth daily.         ROS:  A comprehensive review of systems was negative except for: Cardiovascular: positive for HTN Gastrointestinal: positive for hernia and pain with palpation  Blood pressure (!) 161/89, pulse 66, temperature 98.1 F (36.7 C), temperature source Oral, height 6\' 1"  (1.854 m), weight 257 lb 3.2 oz (116.7 kg), SpO2 97 %. Physical Exam Vitals reviewed.  Constitutional:      Appearance: Normal appearance.  HENT:     Head: Normocephalic.     Nose: Nose normal.     Mouth/Throat:     Mouth: Mucous membranes are moist.  Eyes:     Extraocular Movements: Extraocular movements intact.  Cardiovascular:     Rate and Rhythm: Normal rate and regular rhythm.  Pulmonary:     Effort: Pulmonary effort is normal.     Breath sounds: Normal breath sounds.  Abdominal:     General: There is no distension.     Palpations: Abdomen is soft.  Tenderness: There is abdominal tenderness in the periumbilical area.     Hernia: A hernia is present. Hernia is present in the umbilical area.     Comments: Unable to reduce hernia because patient too tender to allow me to reduce it  Musculoskeletal:        General: Normal range of motion.     Cervical back: Normal range of motion.  Skin:    General: Skin is warm.  Neurological:     General: No focal deficit present.     Mental Status: He is alert and oriented to person, place, and time.  Psychiatric:        Mood and Affect: Mood normal.        Behavior: Behavior normal.        Thought Content: Thought content normal.        Judgment: Judgment normal.    Results: None    Assessment & Plan:  Jesus Trevino is a 55 y.o. male with an umbilical hernia that I cannot get reduced. Discussed with him the  importance or repair and reasons to go the hospital if he has any signs of strangulation. Discussed repair with mesh and risk of bleeding, infection, recurrence, and injury to bowel.   -He is going to call once he decides he wants it repaired.   All questions were answered to the satisfaction of the patient.    Virl Cagey 08/31/2020, 10:18 AM

## 2020-08-24 NOTE — Patient Instructions (Signed)
Open Hernia Repair, Adult °Open hernia repair is a surgical procedure to fix a hernia. A hernia occurs when an internal organ or tissue pushes through a weak spot in the muscles along the wall of the abdomen. Hernias commonly occur in the groin and around the belly button. °Most hernias tend to get worse over time. Often, surgery is done to prevent the hernia from becoming bigger, uncomfortable, or an emergency. Emergency surgery may be needed if contents of the abdomen get stuck in the opening (incarcerated hernia) or if the blood supply gets cut off (strangulated hernia). In an open repair, an incision is made in the abdomen to perform the surgery. °Tell a health care provider about: °Any allergies you have. °All medicines you are taking, including vitamins, herbs, eye drops, creams, and over-the-counter medicines. °Any problems you or family members have had with anesthetic medicines. °Any blood or bone disorders you have. °Any surgeries you have had. °Any medical conditions you have, including any recent cold or flu (influenza)symptoms. °Whether you are pregnant or may be pregnant. °What are the risks? °Generally, this is a safe procedure. However, problems may occur, including: °Long-lasting (chronic) pain. °Bleeding. °Infection. °Damage to the testicles. This can cause shrinking or swelling. °Damage to nearby structures or organs, including the bladder, blood vessels, intestines, or nerves near the hernia. °Blood clots. °Trouble passing urine. °Return of the hernia. °What happens before the procedure? °Medicines °Ask your health care provider about: °Changing or stopping your regular medicines. This is especially important if you are taking diabetes medicines or blood thinners. °Taking medicines such as aspirin and ibuprofen. These medicines can thin your blood. Do not take these medicines unless your health care provider tells you to take them. °Taking over-the-counter medicines, vitamins, herbs, and  supplements. °Surgery safety °Ask your health care provider: °How your surgery site will be marked. °What steps will be taken to help prevent infection. These steps may include: °Removing hair at the surgery site. °Washing skin with a germ-killing soap. °Receiving antibiotic medicine. °General instructions °You may have an exam or testing, such as blood tests or imaging studies. °Do not use any products that contain nicotine or tobacco for at least 4 weeks before the procedure. These products include cigarettes, chewing tobacco, and vaping devices, such as e-cigarettes. If you need help quitting, ask your health care provider. °Let your health care provider know if you develop a cold or any infection before your surgery. If you get an infection before surgery, you may receive antibiotics to treat it. °Plan to have a responsible adult take you home from the hospital or clinic. °If you will be going home right after the procedure, plan to have a responsible adult care for you for the time you are told. This is important. °What happens during the procedure? ° °An IV will be inserted into one of your veins. °You will be given one or more of the following: °A medicine to help you relax (sedative). °A medicine to numb the area (local anesthetic). °A medicine to make you fall asleep (general anesthetic). °Your surgeon will make an incision over the hernia. °The tissues of the hernia will be moved back into place. °The edges of the hernia may be stitched (sutured) together. °The opening in the abdominal muscles will be closed with stitches (sutures). Or, your surgeon will place a mesh patch made of artificial (synthetic) material over the opening. °The incision will be closed with sutures, skin glue, or adhesive strips. °A bandage (dressing) may be   placed over the incision. °The procedure may vary among health care providers and hospitals. °What happens after the procedure? °Your blood pressure, heart rate, breathing rate,  and blood oxygen level will be monitored until you leave the hospital or clinic. °You may be given medicine for pain. °If you were given a sedative during the procedure, it can affect you for several hours. Do not drive or operate machinery until your health care provider says that it is safe. °Summary °Open hernia repair is a surgical procedure to fix a hernia. Hernias commonly occur in the groin and around the belly button. °Emergency surgery may be needed if contents of the abdomen get stuck in the opening (incarcerated hernia) or if the blood supply gets cut off (strangulated hernia). °In this procedure, an incision is made in the abdomen to perform the surgery. °After the procedure, you may be given medicine for pain. °This information is not intended to replace advice given to you by your health care provider. Make sure you discuss any questions you have with your health care provider. °Document Revised: 09/29/2019 Document Reviewed: 09/29/2019 °Elsevier Patient Education © 2022 Elsevier Inc. ° °

## 2020-08-31 ENCOUNTER — Encounter: Payer: Self-pay | Admitting: General Surgery

## 2020-08-31 NOTE — H&P (Signed)
[facility] History and Physical   Reason for Referral: Umbilical hernia Referring Physician: Sharilyn Sites, MD     Chief Complaint   New Patient (Initial Visit)        Jesus Trevino is a 55 y.o. male. HPI: Jesus Trevino is a 55 yo who had a umbilical hernia for 5-6 years. It has gotten larger and is tender to him. He was noted to have this during his recent colonoscopy and they recommended that he come and get evaluated for repair. He says that he has pain with pushing the hernia in and it is 10/10 pain. He denise any obstructive type symptoms, but says that it does not want anyone to push on the hernia.  He otherwise says it does not bother him. He denies any skin changes or redness.       Past Medical History:  Diagnosis Date   Diabetes ([place])     Hypercholesterolemia     Hypertension             Past Surgical History:  Procedure Laterality Date   COLONOSCOPY WITH PROPOFOL N/A 07/02/2020    Procedure: COLONOSCOPY WITH PROPOFOL;  Surgeon: Eloise Harman, DO;  [facility];  Service: Endoscopy;  Laterality: N/A;  ASA I / II / AM procedure   POLYPECTOMY   07/02/2020    Procedure: POLYPECTOMY;  Surgeon: Eloise Harman, DO;  Location: [facility];  Service: Endoscopy;;           Family History  Problem Relation Age of Onset   Asthma Mother     Alcohol abuse Father     Diabetes Sister        Social History        Tobacco Use   Smoking status: Never   Smokeless tobacco: Never  Vaping Use   Vaping Use: Never used  Substance Use Topics   Alcohol use: Never   Drug use: Never      Medications: I have reviewed the patient's current medications. Allergies as of 08/24/2020   No Known Allergies         Medication List           Accurate as of August 24, 2020 11:59 PM. If you have any questions, ask your nurse or doctor.              glimepiride 2 MG tablet Commonly known as: AMARYL Take 2 mg by mouth 2 (two) times daily.     metFORMIN 1000 MG tablet Commonly known as: GLUCOPHAGE Take 1,000 mg by mouth daily.    olmesartan 40 MG tablet Commonly known as: BENICAR Take 20 mg by mouth daily. Takes 1/2 tablet once daily.    rosuvastatin 5 MG tablet Commonly known as: CRESTOR Take 5 mg by mouth daily.               ROS:  A comprehensive review of systems was negative except for: Cardiovascular: positive for HTN Gastrointestinal: positive for hernia and pain with palpation   Blood pressure (!) 161/89, pulse 66, temperature 98.1 F (36.7 C), temperature source Oral, height 6\' 1"  (1.854 m), weight 257 lb 3.2 oz (116.7 kg), SpO2 97 %. Physical Exam Vitals reviewed. Constitutional:      Appearance: Normal appearance. HENT:    Head: Normocephalic.    Nose: Nose normal.    Mouth/Throat:    Mouth: Mucous membranes are moist. Eyes:    Extraocular Movements: Extraocular movements intact. Cardiovascular:  Rate and Rhythm: Normal rate and regular rhythm. Pulmonary:    Effort: Pulmonary effort is normal.    Breath sounds: Normal breath sounds. Abdominal:    General: There is no distension.    Palpations: Abdomen is soft.    Tenderness: There is abdominal tenderness in the periumbilical area.    Hernia: A hernia is present. Hernia is present in the umbilical area.    Comments: Unable to reduce hernia because patient too tender to allow me to reduce it  Musculoskeletal:        General: Normal range of motion.    Cervical back: Normal range of motion. Skin:    General: Skin is warm. Neurological:    General: No focal deficit present.    Mental Status: He is alert and oriented to person, place, and time. Psychiatric:        Mood and Affect: Mood normal.        Behavior: Behavior normal.        Thought Content: Thought content normal.        Judgment: Judgment normal.     Results: None      Assessment & Plan:  Jesus Trevino is a 55 y.o. male with an umbilical hernia that I cannot get  reduced. Discussed with him the importance or repair and reasons to go the hospital if he has any signs of strangulation. Discussed repair with mesh and risk of bleeding, infection, recurrence, and injury to bowel.    -He is going to call once he decides he wants it repaired.    All questions were answered to the satisfaction of the patient.       Jesus Trevino 08/31/2020, 10:18 AM

## 2020-09-14 NOTE — Patient Instructions (Signed)
BENANCIO OSMUNDSON  09/14/2020     @PREFPERIOPPHARMACY @   Your procedure is scheduled on  09/17/2020.   Report to [facility] at   1130  A.M.   Call this number if you have problems the morning of surgery:  [phone]   Remember:  Do not eat or drink after midnight.    DO NOT take any medications for diabetes the morning of your procedure.     Take these medicines the morning of surgery with A SIP OF WATER        NONE     Do not wear jewelry, make-up or nail polish.  Do not wear lotions, powders, or perfumes, or deodorant.  Do not shave 48 hours prior to surgery.  Men may shave face and neck.  Do not bring valuables to the hospital.  [facility] is not responsible for any belongings or valuables.  Contacts, dentures or bridgework may not be worn into surgery.  Leave your suitcase in the car.  After surgery it may be brought to your room.  For patients admitted to the hospital, discharge time will be determined by your treatment team.  Patients discharged the day of surgery will not be allowed to drive home and must have someone with them for 24 hours.    Special instructions:   DO NOT smoke tobacco or vape for 24 hours before your procedure.  Please read over the following fact sheets that you were given. Coughing and Deep Breathing, Surgical Site Infection Prevention, Anesthesia Post-op Instructions, and Care and Recovery After Surgery      Open Hernia Repair, Adult, Care After What can I expect after the procedure? After the procedure, it is common to have: Mild discomfort. Slight bruising. Mild swelling. Pain in the belly (abdomen). A small amount of blood from the cut from surgery (incision). Follow these instructions at home: Your doctor may give you more specific instructions. If you have problems, callyour doctor. Medicines Take over-the-counter and prescription medicines only as told by your doctor. If told, take steps to prevent problems  with pooping (constipation). You may need to: Drink enough fluid to keep your pee (urine) pale yellow. Take medicines. You will be told what medicines to take. Eat foods that are high in fiber. These include beans, whole grains, and fresh fruits and vegetables. Limit foods that are high in fat and sugar. These include fried or sweet foods. Ask your doctor if you should avoid driving or using machines while you are taking your medicine. Incision care  Follow instructions from your doctor about how to take care of your incision. Make sure you: Wash your hands with soap and water for at least 20 seconds before and after you change your bandage (dressing). If you cannot use soap and water, use hand sanitizer. Change your bandage. Leave stitches or skin glue in place for at least 2 weeks. Leave tape strips alone unless you are told to take them off. You may trim the edges of the tape strips if they curl up. Check your incision every day for signs of infection. Check for: More redness, swelling, or pain. More fluid or blood. Warmth. Pus or a bad smell. Wear loose, soft clothing while your incision heals.  Activity  Rest as told by your doctor. Do not lift anything that is heavier than 10 lb (4.5 kg), or the limit that you are told. Do not play contact sports until your doctor says that  this is safe. If you were given a sedative during your procedure, do not drive or use machines until your doctor says that it is safe. A sedative is a medicine that helps you relax. Return to your normal activities when your doctor says that it is safe.  General instructions Do not take baths, swim, or use a hot tub. Ask your doctor about taking showers or sponge baths. Hold a pillow over your belly when you cough or sneeze. This helps with pain. Do not smoke or use any products that contain nicotine or tobacco. If you need help quitting, ask your doctor. Keep all follow-up visits. Contact a doctor if: You  have any of these signs of infection in or around your incision: More redness, swelling, or pain. More fluid or blood. Warmth. Pus. A bad smell. You have a fever or chills. You have blood in your poop (stool). You have not pooped (had a bowel movement) in 2-3 days. Medicine does not help your pain. Get help right away if: You have chest pain, or you are short of breath. You feel faint or light-headed. You have very bad pain. You vomit and your pain is worse. You have pain, swelling, or redness in a leg. These symptoms may be an emergency. Get help right away. Call your local emergency services (911 in the [place] Do not wait to see if the symptoms will go away. Do not drive yourself to the hospital. Summary After this procedure, it is common to have mild discomfort, slight bruising, and mild swelling. Follow instructions from your doctor about how to take care of your cut from surgery (incision). Check every day for signs of infection. Do not lift heavy objects or play contact sports until your doctor says it is safe. Return to your normal activities as told by your doctor. This information is not intended to replace advice given to you by your health care provider. Make sure you discuss any questions you have with your healthcare provider. Document Revised: 09/29/2019 Document Reviewed: 09/29/2019 Elsevier Patient Education  2022 [place] Anesthesia, Adult, Care After This sheet gives you information about how to care for yourself after your procedure. Your health care provider may also give you more specific instructions. If you have problems or questions, contact your health careprovider. What can I expect after the procedure? After the procedure, the following side effects are common: Pain or discomfort at the IV site. Nausea. Vomiting. Sore throat. Trouble concentrating. Feeling cold or chills. Feeling weak or tired. Sleepiness and fatigue. Soreness and body  aches. These side effects can affect parts of the body that were not involved in surgery. Follow these instructions at home: For the time period you were told by your health care provider:  Rest. Do not participate in activities where you could fall or become injured. Do not drive or use machinery. Do not drink alcohol. Do not take sleeping pills or medicines that cause drowsiness. Do not make important decisions or sign legal documents. Do not take care of children on your own.  Eating and drinking Follow any instructions from your health care provider about eating or drinking restrictions. When you feel hungry, start by eating small amounts of foods that are soft and easy to digest (bland), such as toast. Gradually return to your regular diet. Drink enough fluid to keep your urine pale yellow. If you vomit, rehydrate by drinking water, juice, or clear broth. General instructions If you have sleep apnea, surgery and certain medicines  can increase your risk for breathing problems. Follow instructions from your health care provider about wearing your sleep device: Anytime you are sleeping, including during daytime naps. While taking prescription pain medicines, sleeping medicines, or medicines that make you drowsy. Have a responsible adult stay with you for the time you are told. It is important to have someone help care for you until you are awake and alert. Return to your normal activities as told by your health care provider. Ask your health care provider what activities are safe for you. Take over-the-counter and prescription medicines only as told by your health care provider. If you smoke, do not smoke without supervision. Keep all follow-up visits as told by your health care provider. This is important. Contact a health care provider if: You have nausea or vomiting that does not get better with medicine. You cannot eat or drink without vomiting. You have pain that does not get  better with medicine. You are unable to pass urine. You develop a skin rash. You have a fever. You have redness around your IV site that gets worse. Get help right away if: You have difficulty breathing. You have chest pain. You have blood in your urine or stool, or you vomit blood. Summary After the procedure, it is common to have a sore throat or nausea. It is also common to feel tired. Have a responsible adult stay with you for the time you are told. It is important to have someone help care for you until you are awake and alert. When you feel hungry, start by eating small amounts of foods that are soft and easy to digest (bland), such as toast. Gradually return to your regular diet. Drink enough fluid to keep your urine pale yellow. Return to your normal activities as told by your health care provider. Ask your health care provider what activities are safe for you. This information is not intended to replace advice given to you by your health care provider. Make sure you discuss any questions you have with your healthcare provider. Document Revised: 10/30/2019 Document Reviewed: 05/29/2019 Elsevier Patient Education  2022 [place]. How to Use Chlorhexidine for Bathing Chlorhexidine gluconate (CHG) is a germ-killing (antiseptic) solution that is used to clean the skin. It can get rid of the bacteria that normally live on the skin and can keep them away for about 24 hours. To clean your skin with CHG, you may be given: A CHG solution to use in the shower or as part of a sponge bath. A prepackaged cloth that contains CHG. Cleaning your skin with CHG may help lower the risk for infection: While you are staying in the intensive care unit of the hospital. If you have a vascular access, such as a central line, to provide short-term or long-term access to your veins. If you have a catheter to drain urine from your bladder. If you are on a ventilator. A ventilator is a machine that helps  you breathe by moving air in and out of your lungs. After surgery. What are the risks? Risks of using CHG include: A skin reaction. Hearing loss, if CHG gets in your ears. Eye injury, if CHG gets in your eyes and is not rinsed out. The CHG product catching fire. Make sure that you avoid smoking and flames after applying CHG to your skin. Do not use CHG: If you have a chlorhexidine allergy or have previously reacted to chlorhexidine. On babies younger than 63 months of age. How to use CHG  solution Use CHG only as told by your health care provider, and follow the instructions on the label. Use the full amount of CHG as directed. Usually, this is one bottle. During a shower Follow these steps when using CHG solution during a shower (unless your health care provider gives you different instructions): Start the shower. Use your normal soap and shampoo to wash your face and hair. Turn off the shower or move out of the shower stream. Pour the CHG onto a clean washcloth. Do not use any type of brush or rough-edged sponge. Starting at your neck, lather your body down to your toes. Make sure you follow these instructions: If you will be having surgery, pay special attention to the part of your body where you will be having surgery. Scrub this area for at least 1 minute. Do not use CHG on your head or face. If the solution gets into your ears or eyes, rinse them well with water. Avoid your genital area. Avoid any areas of skin that have broken skin, cuts, or scrapes. Scrub your back and under your arms. Make sure to wash skin folds. Let the lather sit on your skin for 1-2 minutes or as long as told by your health care provider. Thoroughly rinse your entire body in the shower. Make sure that all body creases and crevices are rinsed well. Dry off with a clean towel. Do not put any substances on your body afterward--such as powder, lotion, or perfume--unless you are told to do so by your health care  provider. Only use lotions that are recommended by the manufacturer. Put on clean clothes or pajamas. If it is the night before your surgery, sleep in clean sheets.  During a sponge bath Follow these steps when using CHG solution during a sponge bath (unless your health care provider gives you different instructions): Use your normal soap and shampoo to wash your face and hair. Pour the CHG onto a clean washcloth. Starting at your neck, lather your body down to your toes. Make sure you follow these instructions: If you will be having surgery, pay special attention to the part of your body where you will be having surgery. Scrub this area for at least 1 minute. Do not use CHG on your head or face. If the solution gets into your ears or eyes, rinse them well with water. Avoid your genital area. Avoid any areas of skin that have broken skin, cuts, or scrapes. Scrub your back and under your arms. Make sure to wash skin folds. Let the lather sit on your skin for 1-2 minutes or as long as told by your health care provider. Using a different clean, wet washcloth, thoroughly rinse your entire body. Make sure that all body creases and crevices are rinsed well. Dry off with a clean towel. Do not put any substances on your body afterward--such as powder, lotion, or perfume--unless you are told to do so by your health care provider. Only use lotions that are recommended by the manufacturer. Put on clean clothes or pajamas. If it is the night before your surgery, sleep in clean sheets. How to use CHG prepackaged cloths Only use CHG cloths as told by your health care provider, and follow the instructions on the label. Use the CHG cloth on clean, dry skin. Do not use the CHG cloth on your head or face unless your health care provider tells you to. When washing with the CHG cloth: Avoid your genital area. Avoid any areas of skin  that have broken skin, cuts, or scrapes. Before surgery Follow these steps  when using a CHG cloth to clean before surgery (unless your health care provider gives you different instructions): Using the CHG cloth, vigorously scrub the part of your body where you will be having surgery. Scrub using a back-and-forth motion for 3 minutes. The area on your body should be completely wet with CHG when you are done scrubbing. Do not rinse. Discard the cloth and let the area air-dry. Do not put any substances on the area afterward, such as powder, lotion, or perfume. Put on clean clothes or pajamas. If it is the night before your surgery, sleep in clean sheets.  For general bathing Follow these steps when using CHG cloths for general bathing (unless your health care provider gives you different instructions). Use a separate CHG cloth for each area of your body. Make sure you wash between any folds of skin and between your fingers and toes. Wash your body in the following order, switching to a new cloth after each step: The front of your neck, shoulders, and chest. Both of your arms, under your arms, and your hands. Your stomach and groin area, avoiding the genitals. Your right leg and foot. Your left leg and foot. The back of your neck, your back, and your buttocks. Do not rinse. Discard the cloth and let the area air-dry. Do not put any substances on your body afterward--such as powder, lotion, or perfume--unless you are told to do so by your health care provider. Only use lotions that are recommended by the manufacturer. Put on clean clothes or pajamas. Contact a health care provider if: Your skin gets irritated after scrubbing. You have questions about using your solution or cloth. Get help right away if: Your eyes become very red or swollen. Your eyes itch badly. Your skin itches badly and is red or swollen. Your hearing changes. You have trouble seeing. You have swelling or tingling in your mouth or throat. You have trouble breathing. You swallow any  chlorhexidine. Summary Chlorhexidine gluconate (CHG) is a germ-killing (antiseptic) solution that is used to clean the skin. Cleaning your skin with CHG may help to lower your risk for infection. You may be given CHG to use for bathing. It may be in a bottle or in a prepackaged cloth to use on your skin. Carefully follow your health care provider's instructions and the instructions on the product label. Do not use CHG if you have a chlorhexidine allergy. Contact your health care provider if your skin gets irritated after scrubbing. This information is not intended to replace advice given to you by your health care provider. Make sure you discuss any questions you have with your healthcare provider. Document Revised: 06/27/2019 Document Reviewed: 08/01/2019 Elsevier Patient Education  [place].

## 2020-09-15 ENCOUNTER — Encounter (HOSPITAL_COMMUNITY): Payer: Self-pay

## 2020-09-15 ENCOUNTER — Other Ambulatory Visit: Payer: Self-pay

## 2020-09-15 ENCOUNTER — Encounter (HOSPITAL_COMMUNITY)
Admission: RE | Admit: 2020-09-15 | Discharge: 2020-09-15 | Disposition: A | Discharge status: Home / Self Care | Payer: BC Managed Care – PPO | Source: Ambulatory Visit | Attending: General Surgery | Admitting: General Surgery

## 2020-09-15 DIAGNOSIS — Z7984 Long term (current) use of oral hypoglycemic drugs: Secondary | ICD-10-CM | POA: Diagnosis not present

## 2020-09-15 DIAGNOSIS — Z01818 Encounter for other preprocedural examination: Secondary | ICD-10-CM | POA: Insufficient documentation

## 2020-09-15 DIAGNOSIS — Z79899 Other long term (current) drug therapy: Secondary | ICD-10-CM | POA: Diagnosis not present

## 2020-09-15 DIAGNOSIS — K420 Umbilical hernia with obstruction, without gangrene: Secondary | ICD-10-CM | POA: Diagnosis not present

## 2020-09-15 DIAGNOSIS — Z833 Family history of diabetes mellitus: Secondary | ICD-10-CM | POA: Diagnosis not present

## 2020-09-15 DIAGNOSIS — E119 Type 2 diabetes mellitus without complications: Secondary | ICD-10-CM | POA: Insufficient documentation

## 2020-09-15 HISTORY — DX: Sleep apnea, unspecified: G47.30

## 2020-09-15 LAB — BASIC METABOLIC PANEL
Anion gap: 7 (ref 5–15)
BUN: 10 mg/dL (ref 6–20)
CO2: 27 mmol/L (ref 22–32)
Calcium: 8.5 mg/dL — ABNORMAL LOW (ref 8.9–10.3)
Chloride: 100 mmol/L (ref 98–111)
Creatinine, Ser: 0.9 mg/dL (ref 0.61–1.24)
GFR, Estimated: >60 mL/min (ref >60)
Glucose, Bld: 94 mg/dL (ref 70–99)
Potassium: 3.4 mmol/L — ABNORMAL LOW (ref 3.5–5.1)
Sodium: 134 mmol/L — ABNORMAL LOW (ref 135–145)

## 2020-09-15 LAB — HEMOGLOBIN A1C
Hgb A1c MFr Bld: 8.3 % — ABNORMAL HIGH (ref 4.8–5.6)
Mean Plasma Glucose: 191.51 mg/dL

## 2020-09-17 ENCOUNTER — Encounter (HOSPITAL_COMMUNITY)
Admission: RE | Disposition: A | Discharge status: Home / Self Care | Payer: Self-pay | Source: Home / Self Care | Attending: General Surgery

## 2020-09-17 ENCOUNTER — Ambulatory Visit (HOSPITAL_COMMUNITY)
Admission: RE | Admit: 2020-09-17 | Discharge: 2020-09-17 | Disposition: A | Discharge status: Home / Self Care | Payer: BC Managed Care – PPO | Attending: General Surgery | Admitting: General Surgery

## 2020-09-17 ENCOUNTER — Encounter (HOSPITAL_COMMUNITY): Payer: Self-pay | Admitting: General Surgery

## 2020-09-17 ENCOUNTER — Ambulatory Visit (HOSPITAL_COMMUNITY): Payer: BC Managed Care – PPO | Admitting: Anesthesiology

## 2020-09-17 DIAGNOSIS — K420 Umbilical hernia with obstruction, without gangrene: Secondary | ICD-10-CM | POA: Diagnosis not present

## 2020-09-17 DIAGNOSIS — E119 Type 2 diabetes mellitus without complications: Secondary | ICD-10-CM | POA: Insufficient documentation

## 2020-09-17 DIAGNOSIS — Z79899 Other long term (current) drug therapy: Secondary | ICD-10-CM | POA: Insufficient documentation

## 2020-09-17 DIAGNOSIS — Z833 Family history of diabetes mellitus: Secondary | ICD-10-CM | POA: Insufficient documentation

## 2020-09-17 DIAGNOSIS — Z7984 Long term (current) use of oral hypoglycemic drugs: Secondary | ICD-10-CM | POA: Insufficient documentation

## 2020-09-17 HISTORY — PX: UMBILICAL HERNIA REPAIR: SHX196

## 2020-09-17 LAB — GLUCOSE, CAPILLARY
Glucose-Capillary: 136 mg/dL — ABNORMAL HIGH (ref 70–99)
Glucose-Capillary: 139 mg/dL — ABNORMAL HIGH (ref 70–99)

## 2020-09-17 SURGERY — REPAIR, HERNIA, UMBILICAL, ADULT
Anesthesia: General

## 2020-09-17 MED ORDER — SUGAMMADEX SODIUM 200 MG/2ML IV SOLN
INTRAVENOUS | Status: DC | PRN
  Administered 2020-09-17: 200 mg via INTRAVENOUS

## 2020-09-17 MED ORDER — SUCCINYLCHOLINE CHLORIDE 200 MG/10ML IV SOSY
PREFILLED_SYRINGE | INTRAVENOUS | Status: AC
  Filled 2020-09-17: qty 10

## 2020-09-17 MED ORDER — LACTATED RINGERS IV SOLN: INTRAVENOUS | Status: DC

## 2020-09-17 MED ORDER — KETOROLAC TROMETHAMINE 30 MG/ML IJ SOLN
INTRAMUSCULAR | Status: DC | PRN
  Administered 2020-09-17: 30 mg via INTRAVENOUS

## 2020-09-17 MED ORDER — CHLORHEXIDINE GLUCONATE CLOTH 2 % EX PADS: 6.0000 | MEDICATED_PAD | Freq: Once | CUTANEOUS | Status: DC

## 2020-09-17 MED ORDER — CEFAZOLIN SODIUM-DEXTROSE 2-4 GM/100ML-% IV SOLN
INTRAVENOUS | Status: AC
  Filled 2020-09-17: qty 100

## 2020-09-17 MED ORDER — DEXMEDETOMIDINE (PRECEDEX) IN NS 20 MCG/5ML (4 MCG/ML) IV SYRINGE
PREFILLED_SYRINGE | INTRAVENOUS | Status: DC | PRN
  Administered 2020-09-17: 20 ug via INTRAVENOUS

## 2020-09-17 MED ORDER — ORAL CARE MOUTH RINSE: 15.0000 mL | Freq: Once | OROMUCOSAL | Status: AC

## 2020-09-17 MED ORDER — SODIUM CHLORIDE 0.9 % IR SOLN
Status: DC | PRN
  Administered 2020-09-17: 1000 mL

## 2020-09-17 MED ORDER — ROCURONIUM BROMIDE 10 MG/ML (PF) SYRINGE
PREFILLED_SYRINGE | INTRAVENOUS | Status: DC | PRN
  Administered 2020-09-17: 30 mg via INTRAVENOUS

## 2020-09-17 MED ORDER — ONDANSETRON HCL 4 MG/2ML IJ SOLN
INTRAMUSCULAR | Status: AC
  Filled 2020-09-17: qty 2

## 2020-09-17 MED ORDER — PROPOFOL 10 MG/ML IV BOLUS
INTRAVENOUS | Status: DC | PRN
  Administered 2020-09-17: 200 mg via INTRAVENOUS
  Administered 2020-09-17: 50 mg via INTRAVENOUS

## 2020-09-17 MED ORDER — MIDAZOLAM HCL 2 MG/2ML IJ SOLN
INTRAMUSCULAR | Status: AC
  Filled 2020-09-17: qty 2

## 2020-09-17 MED ORDER — KETAMINE HCL 50 MG/5ML IJ SOSY
PREFILLED_SYRINGE | INTRAMUSCULAR | Status: AC
  Filled 2020-09-17: qty 5

## 2020-09-17 MED ORDER — ROCURONIUM BROMIDE 10 MG/ML (PF) SYRINGE
PREFILLED_SYRINGE | INTRAVENOUS | Status: AC
  Filled 2020-09-17: qty 10

## 2020-09-17 MED ORDER — PROPOFOL 10 MG/ML IV BOLUS
INTRAVENOUS | Status: AC
  Filled 2020-09-17: qty 20

## 2020-09-17 MED ORDER — FENTANYL CITRATE (PF) 100 MCG/2ML IJ SOLN
INTRAMUSCULAR | Status: DC | PRN
  Administered 2020-09-17 (×2): 50 ug via INTRAVENOUS

## 2020-09-17 MED ORDER — LIDOCAINE HCL (PF) 2 % IJ SOLN
INTRAMUSCULAR | Status: AC
  Filled 2020-09-17: qty 5

## 2020-09-17 MED ORDER — DEXAMETHASONE SODIUM PHOSPHATE 10 MG/ML IJ SOLN
INTRAMUSCULAR | Status: DC | PRN
  Administered 2020-09-17: 4 mg via INTRAVENOUS

## 2020-09-17 MED ORDER — KETOROLAC TROMETHAMINE 30 MG/ML IJ SOLN
INTRAMUSCULAR | Status: AC
  Filled 2020-09-17: qty 1

## 2020-09-17 MED ORDER — SUCCINYLCHOLINE CHLORIDE 200 MG/10ML IV SOSY
PREFILLED_SYRINGE | INTRAVENOUS | Status: DC | PRN
  Administered 2020-09-17: 120 mg via INTRAVENOUS

## 2020-09-17 MED ORDER — GLYCOPYRROLATE PF 0.2 MG/ML IJ SOSY
PREFILLED_SYRINGE | INTRAMUSCULAR | Status: AC
  Filled 2020-09-17: qty 1

## 2020-09-17 MED ORDER — FENTANYL CITRATE (PF) 100 MCG/2ML IJ SOLN
INTRAMUSCULAR | Status: AC
  Filled 2020-09-17: qty 2

## 2020-09-17 MED ORDER — OXYCODONE HCL 5 MG PO TABS: 5.0000 mg | ORAL_TABLET | ORAL | 0 refills | Status: DC | PRN

## 2020-09-17 MED ORDER — ONDANSETRON HCL 4 MG/2ML IJ SOLN
INTRAMUSCULAR | Status: DC | PRN
  Administered 2020-09-17: 4 mg via INTRAVENOUS

## 2020-09-17 MED ORDER — MEPERIDINE HCL 50 MG/ML IJ SOLN: 6.2500 mg | INTRAMUSCULAR | Status: DC | PRN

## 2020-09-17 MED ORDER — BUPIVACAINE LIPOSOME 1.3 % IJ SUSP
INTRAMUSCULAR | Status: AC
  Filled 2020-09-17: qty 20

## 2020-09-17 MED ORDER — HYDROMORPHONE HCL 1 MG/ML IJ SOLN: 0.2500 mg | INTRAMUSCULAR | Status: DC | PRN

## 2020-09-17 MED ORDER — CEFAZOLIN SODIUM-DEXTROSE 2-4 GM/100ML-% IV SOLN
2.0000 g | INTRAVENOUS | Status: AC
  Administered 2020-09-17: 2 g via INTRAVENOUS

## 2020-09-17 MED ORDER — LIDOCAINE 2% (20 MG/ML) 5 ML SYRINGE
INTRAMUSCULAR | Status: DC | PRN
  Administered 2020-09-17: 100 mg via INTRAVENOUS

## 2020-09-17 MED ORDER — MIDAZOLAM HCL 5 MG/5ML IJ SOLN
INTRAMUSCULAR | Status: DC | PRN
Start: 2020-09-17 — End: 2020-09-17
  Administered 2020-09-17: 2 mg via INTRAVENOUS

## 2020-09-17 MED ORDER — CHLORHEXIDINE GLUCONATE 0.12 % MT SOLN
15.0000 mL | Freq: Once | OROMUCOSAL | Status: AC
  Administered 2020-09-17: 15 mL via OROMUCOSAL
  Filled 2020-09-17: qty 15

## 2020-09-17 MED ORDER — ONDANSETRON HCL 4 MG/2ML IJ SOLN: 4.0000 mg | Freq: Once | INTRAMUSCULAR | Status: DC | PRN

## 2020-09-17 MED ORDER — ONDANSETRON HCL 4 MG PO TABS: 4.0000 mg | ORAL_TABLET | Freq: Three times a day (TID) | ORAL | 1 refills | Status: DC | PRN

## 2020-09-17 MED ORDER — KETAMINE HCL 10 MG/ML IJ SOLN
INTRAMUSCULAR | Status: DC | PRN
  Administered 2020-09-17: 30 mg via INTRAVENOUS
  Administered 2020-09-17: 20 mg via INTRAVENOUS

## 2020-09-17 MED ORDER — BUPIVACAINE LIPOSOME 1.3 % IJ SUSP
INTRAMUSCULAR | Status: DC | PRN
  Administered 2020-09-17: 20 mL

## 2020-09-17 SURGICAL SUPPLY — 40 items
ADH SKN CLS APL DERMABOND .7 (GAUZE/BANDAGES/DRESSINGS) ×1
APL PRP STRL LF DISP 70% ISPRP (MISCELLANEOUS) ×1
APL SWBSTK 6 STRL LF DISP (MISCELLANEOUS) ×1
APPLICATOR COTTON TIP 6 STRL (MISCELLANEOUS) IMPLANT
APPLICATOR COTTON TIP 6IN STRL (MISCELLANEOUS) ×2
BLADE SURG 15 STRL LF DISP TIS (BLADE) ×1 IMPLANT
BLADE SURG 15 STRL SS (BLADE) ×2
CHLORAPREP W/TINT 26 (MISCELLANEOUS) ×2 IMPLANT
CLOTH BEACON ORANGE TIMEOUT ST (SAFETY) ×2 IMPLANT
COVER LIGHT HANDLE STERIS (MISCELLANEOUS) ×4 IMPLANT
DERMABOND ADVANCED (GAUZE/BANDAGES/DRESSINGS) ×1
DERMABOND ADVANCED .7 DNX12 (GAUZE/BANDAGES/DRESSINGS) ×1 IMPLANT
DRSG TEGADERM 2-3/8X2-3/4 SM (GAUZE/BANDAGES/DRESSINGS) ×1 IMPLANT
ELECT REM PT RETURN 9FT ADLT (ELECTROSURGICAL) ×2
ELECTRODE REM PT RTRN 9FT ADLT (ELECTROSURGICAL) ×1 IMPLANT
GAUZE SPONGE 2X2 12PLY NS (GAUZE/BANDAGES/DRESSINGS) ×1 IMPLANT
GLOVE SURG ENC MOIS LTX SZ6.5 (GLOVE) ×2 IMPLANT
GLOVE SURG UNDER POLY LF SZ6.5 (GLOVE) ×2 IMPLANT
GLOVE SURG UNDER POLY LF SZ7 (GLOVE) ×2 IMPLANT
GOWN STRL REUS W/TWL LRG LVL3 (GOWN DISPOSABLE) ×4 IMPLANT
INST SET MINOR GENERAL (KITS) ×2 IMPLANT
KIT TURNOVER KIT A (KITS) ×2 IMPLANT
MANIFOLD NEPTUNE II (INSTRUMENTS) ×2 IMPLANT
MESH VENTRALEX ST 8CM LRG (Mesh General) ×1 IMPLANT
NDL HYPO 18GX1.5 BLUNT FILL (NEEDLE) ×1 IMPLANT
NDL HYPO 21X1.5 SAFETY (NEEDLE) ×1 IMPLANT
NEEDLE HYPO 18GX1.5 BLUNT FILL (NEEDLE) ×2 IMPLANT
NEEDLE HYPO 21X1.5 SAFETY (NEEDLE) ×2 IMPLANT
NS IRRIG 1000ML POUR BTL (IV SOLUTION) ×2 IMPLANT
PACK MINOR (CUSTOM PROCEDURE TRAY) ×2 IMPLANT
PAD ARMBOARD 7.5X6 YLW CONV (MISCELLANEOUS) ×2 IMPLANT
PENCIL SMOKE EVACUATOR (MISCELLANEOUS) ×2 IMPLANT
SET BASIN LINEN APH (SET/KITS/TRAYS/PACK) ×2 IMPLANT
SPONGE GAUZE 2X2 8PLY STRL LF (GAUZE/BANDAGES/DRESSINGS) ×1 IMPLANT
SUT ETHIBOND NAB MO 7 #0 18IN (SUTURE) ×3 IMPLANT
SUT MNCRL AB 4-0 PS2 18 (SUTURE) ×2 IMPLANT
SUT VIC AB 2-0 CT2 27 (SUTURE) ×1 IMPLANT
SUT VIC AB 3-0 SH 27 (SUTURE) ×2
SUT VIC AB 3-0 SH 27X BRD (SUTURE) ×1 IMPLANT
SYR 20ML LL LF (SYRINGE) ×4 IMPLANT

## 2020-09-17 NOTE — Anesthesia Procedure Notes (Signed)
Procedure Name: Intubation Date/Time: 09/17/2020 12:20 PM Performed by: Orlie Dakin, CRNA Pre-anesthesia Checklist: Patient identified, Emergency Drugs available, Suction available and Patient being monitored Patient Re-evaluated:Patient Re-evaluated prior to induction Oxygen Delivery Method: Circle system utilized Preoxygenation: Pre-oxygenation with 100% oxygen Induction Type: IV induction Ventilation: Mask ventilation without difficulty Laryngoscope Size: Mac and 4 Grade View: Grade II Tube type: Oral Tube size: 7.5 mm Number of attempts: 1 Airway Equipment and Method: Stylet Placement Confirmation: ETT inserted through vocal cords under direct vision, positive ETCO2 and breath sounds checked- equal and bilateral Secured at: 23 cm Tube secured with: Tape Dental Injury: Teeth and Oropharynx as per pre-operative assessment

## 2020-09-17 NOTE — Discharge Instructions (Signed)
Discharge Instructions Hernia:  Common Complaints: Pain at the incision site is common. This will improve with time. Take your pain medications as described below. Some nausea is common and poor appetite. The main goal is to stay hydrated the first few days after surgery.   Diet/ Activity: Diet as tolerated. You may not have an appetite, but it is important to stay hydrated. Drink 64 ounces of water a day. Your appetite will return with time.  Remove the small clear dressing and gauze after two days (48 hours). Trim the gauze off the glue that is underneath if the gauze is stuck to the glue. Shower per your regular routine daily.  Do not take hot showers. Take warm showers that are less than 10 minutes. Rest and listen to your body, but do not remain in bed all day.Walk everyday for at least 15-20 minutes.  Deep cough and move around every 1-2 hours in the first few days after surgery. Do not pick at the dermabond glue on your incision sites.  This glue film will remain in place for 1-2 weeks and will start to peel off. Do not place lotions or balms on your incision unless instructed to specifically by Dr. Madalee Altmann. Do not lift > 10 lbs, perform excessive bending, pushing, pulling, squatting for 6-8 weeks after surgery. Where your abdominal binder with activity as much as possible. The activity restrictions and the abdominal binder are to prevent hernia formation at your incision while you are healing.   Pain Expectations and Narcotics: -After surgery you will have pain associated with your incisions and this is normal. The pain is muscular and nerve pain, and will get better with time. -You are encouraged and expected to take non narcotic medications like tylenol and ibuprofen (when able) to treat pain as multiple modalities can aid with pain treatment. -Narcotics are only used when pain is severe or there is breakthrough pain. -You are not expected to have a pain score of 0 after surgery, as  we cannot prevent pain. A pain score of 3-4 that allows you to be functional, move, walk, and tolerate some activity is the goal. The pain will continue to improve over the days after surgery and is dependent on your surgery. -Due to [place] law, we are only able to give a certain amount of pain medication to treat post operative pain, and we only give additional narcotics on a patient by patient basis.  -For most laparoscopic surgery, studies have shown that the majority of patients only need 10-15 narcotic pills, and for open surgeries most patients only need 15-20.   -Having appropriate expectations of pain and knowledge of pain management with non narcotics is important as we do not want anyone to become addicted to narcotic pain medication.  -Using ice packs in the first 48 hours and heating pads after 48 hours, wearing an abdominal binder (when recommended), and using over the counter medications are all ways to help with pain management.   -Simple acts like meditation and mindfulness practices after surgery can also help with pain control and research has proven the benefit of these practices.  Medication: Take tylenol and ibuprofen as needed for pain control, alternating every 4-6 hours.  Example:  Tylenol 1000mg @ 6am, 12noon, 6pm, 12midnight (Do not exceed 4000mg of tylenol a day). Ibuprofen 800mg @ 9am, 3pm, 9pm, 3am (Do not exceed 3600mg of ibuprofen a day).  Take Roxicodone for breakthrough pain every 4 hours.  Take Colace for constipation related to narcotic   pain medication. If you do not have a bowel movement in 2 days, take Miralax over the counter.  Drink plenty of water to also prevent constipation.   Contact Information: If you have questions or concerns, please call our office, [phone], Monday- Thursday 8AM-5PM and Friday 8AM-12Noon.  If it is after hours or on the weekend, please call [facility], [phone], [phone], and ask to speak to the surgeon on call for Dr.  Constance Haw at [facility].

## 2020-09-17 NOTE — Op Note (Signed)
[facility] Operative Note  09/17/20  Preoperative Diagnosis: Incarcerated Umbilical hernia    Postoperative Diagnosis: Same   Procedure(s) Performed:  Umbilical hernia repair with mesh    Surgeon: Lanell Matar. Constance Haw, MD   Assistants: No qualified resident was available    Anesthesia: General anesthesia    Anesthesiologist: Denese Killings, MD    Specimens: None    Estimated Blood Loss: Minimal   Blood Replacement: None    Complications: None   Wound Class: Clean    Operative Indications:  Mr. Nethery is a 55 yo with an incarcerated umbilical hernia he would not allow me to reduce in the office. We discussed repair and risk of bleeding, infection, use of mesh, recurrence, injury to bowel.   Findings: 3cm defect with omentum incarcerated    Procedure: The patient was taken to the operating room and placed supine. General endotracheal anesthesia was induced. Intravenous antibiotics were  administered per protocol.  The abdomen was prepared and draped in the usual sterile fashion.   The umbilical hernia was noted to be reducible once he was asleep and measured about 3cm. An incision was made under the umbilicus, and carried down through the subcutaneous tissue with electrocautery.  Dissection was performed down to the level of the fascia, exposing the hernia sac.  The hernia sac was opened with care, and excess hernia sac was resected with electrocautery.  A finger was ran on the underlying peritoneum and this was clear.  A 8 cm Ventralex St Hernia Patch was placed and secured with 0 Ethibond sutures ensuring that it was against the peritoneal cavity.  The hernia defect was then closed with 0 Ethibond suture in an interrupted fashion over the patch.  The umbilicus was tacked to the fascia with a 3-0 Vicryl suture.   Hemostasis was confirmed. The skin was closed with a running 4-0 Monocryl suture and dermabond.  After the dermabond dried a 2X2 and tegaderm were  placed over the umbilicus to act as a pressure dressing.    All counts were correct at the end of the case. The patient was awakened from anesthesia and extubated without complication.  The patient went to the PACU in stable condition.  Curlene Labrum, MD [facility] [address] [place], [place] [address] [phone] (office)

## 2020-09-17 NOTE — Transfer of Care (Signed)
Immediate Anesthesia Transfer of Care Note  Patient: Jesus Trevino  Procedure(s) Performed: HERNIA REPAIR UMBILICAL ADULT W/MESH  Patient Location: PACU  Anesthesia Type:General  Level of Consciousness: drowsy  Airway & Oxygen Therapy: Patient Spontanous Breathing and Patient connected to face mask oxygen  Post-op Assessment: Report given to RN and Post -op Vital signs reviewed and stable  Post vital signs: Reviewed and stable  Last Vitals:  Vitals Value Taken Time  BP 140/87 09/17/20 1315  Temp    Pulse 66 09/17/20 1317  Resp 13 09/17/20 1317  SpO2 99 % 09/17/20 1317  Vitals shown include unvalidated device data.  Last Pain:  Vitals:   09/17/20 1048  TempSrc: Oral  PainSc: 0-No pain         Complications: No notable events documented.

## 2020-09-17 NOTE — Interval H&P Note (Signed)
History and Physical Interval Note:  09/17/2020 12:04 PM  Jesus Trevino  has presented today for surgery, with the diagnosis of Umbilical hernia [id number].  The various methods of treatment have been discussed with the patient and family. After consideration of risks, benefits and other options for treatment, the patient has consented to  Procedure(s): HERNIA REPAIR UMBILICAL ADULT W/MESH (N/A) as a surgical intervention.  The patient's history has been reviewed, patient examined, no change in status, stable for surgery.  I have reviewed the patient's chart and labs.  Questions were answered to the patient's satisfaction.     Virl Cagey

## 2020-09-17 NOTE — Anesthesia Postprocedure Evaluation (Signed)
Anesthesia Post Note  Patient: Jesus Trevino  Procedure(s) Performed: HERNIA REPAIR UMBILICAL ADULT W/MESH  Patient location during evaluation: PACU Anesthesia Type: General Level of consciousness: awake and alert and oriented Pain management: pain level controlled Vital Signs Assessment: post-procedure vital signs reviewed and stable Respiratory status: spontaneous breathing and respiratory function stable Cardiovascular status: blood pressure returned to baseline and stable Postop Assessment: no apparent nausea or vomiting Anesthetic complications: no   No notable events documented.   Last Vitals:  Vitals:   09/17/20 1345 09/17/20 1400  BP: 132/84 (!) 161/98  Pulse: 86 83  Resp: 17 18  Temp:  36.5 C  SpO2: 96% 99%    Last Pain:  Vitals:   09/17/20 1400  TempSrc: Oral  PainSc: 2                  Ertha Nabor C Shane Melby

## 2020-09-17 NOTE — Anesthesia Preprocedure Evaluation (Signed)
Anesthesia Evaluation  Patient identified by MRN, date of birth, ID band Patient awake    Reviewed: Allergy & Precautions, NPO status , Patient's Chart, lab work & pertinent test results  History of Anesthesia Complications Negative for: history of anesthetic complications  Airway Mallampati: II  TM Distance: >3 FB Neck ROM: Full    Dental  (+) Dental Advisory Given, Chipped, Missing,    Pulmonary sleep apnea ,    Pulmonary exam normal breath sounds clear to auscultation       Cardiovascular Exercise Tolerance: Good hypertension, Pt. on medications Normal cardiovascular exam Rhythm:Regular Rate:Normal     Neuro/Psych negative neurological ROS  negative psych ROS   GI/Hepatic negative GI ROS, Neg liver ROS,   Endo/Other  diabetes, Well Controlled, Type 2, Oral Hypoglycemic Agents  Renal/GU negative Renal ROS     Musculoskeletal negative musculoskeletal ROS (+)   Abdominal (+)  Abdomen: tender.  Tender umbilical hernia  Peds  Hematology negative hematology ROS (+)   Anesthesia Other Findings   Reproductive/Obstetrics negative OB ROS                             Anesthesia Physical  Anesthesia Plan  ASA: 2  Anesthesia Plan: General   Post-op Pain Management:    Induction: Intravenous  PONV Risk Score and Plan: 3 and Ondansetron and Midazolam  Airway Management Planned: Oral ETT  Additional Equipment:   Intra-op Plan:   Post-operative Plan: Extubation in OR  Informed Consent: I have reviewed the patients History and Physical, chart, labs and discussed the procedure including the risks, benefits and alternatives for the proposed anesthesia with the patient or authorized representative who has indicated his/her understanding and acceptance.     Dental advisory given  Plan Discussed with: CRNA and Surgeon  Anesthesia Plan Comments:         Anesthesia Quick  Evaluation

## 2020-09-17 NOTE — Progress Notes (Signed)
[facility]  Wife updated. Rx to [facility]. Will see in August. He can send any work paperwork to the office.  Curlene Labrum, MD [facility] [address] [place], [place] [address] [phone] (office)

## 2020-09-20 ENCOUNTER — Encounter (HOSPITAL_COMMUNITY): Payer: Self-pay | Admitting: General Surgery

## 2020-10-11 ENCOUNTER — Telehealth: Payer: Self-pay | Admitting: Family Medicine

## 2020-10-11 NOTE — Telephone Encounter (Signed)
FMLA paperwork filled out. Patient aware and would like to come pick it up. Copy made and original left up front.  Out of work 09/17/2020 - 11/01/2020

## 2020-10-19 ENCOUNTER — Encounter: Payer: Self-pay | Admitting: General Surgery

## 2020-10-19 ENCOUNTER — Other Ambulatory Visit: Payer: Self-pay

## 2020-10-19 ENCOUNTER — Ambulatory Visit (INDEPENDENT_AMBULATORY_CARE_PROVIDER_SITE_OTHER): Payer: BC Managed Care – PPO | Admitting: General Surgery

## 2020-10-19 VITALS — BP 172/82 | HR 68 | Temp 98.4°F | Resp 14 | Ht 73.0 in | Wt 269.0 lb

## 2020-10-19 DIAGNOSIS — K420 Umbilical hernia with obstruction, without gangrene: Secondary | ICD-10-CM

## 2020-10-19 NOTE — Patient Instructions (Addendum)
Ok to return to work with restrictions on 10/25/2020 of no lifting over 10 lbs. He can sweep and vacuum.  He can work without any restrictions starting 11/01/2020.

## 2020-10-21 NOTE — Progress Notes (Signed)
[facility] Note   HPI:  55 y.o. Male presents to clinic for post-op follow-up evaluation after umbilical hernia repair. Patient reports doing well with no pain. 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  Review of Systems:  No pain No redness or drainage All other review of systems: otherwise negative   Vital Signs:  BP (!) 172/82   Pulse 68   Temp 98.4 F (36.9 C) (Other (Comment))   Resp 14   Ht '6\' 1"'$  (1.854 m)   Wt 269 lb (122 kg)   SpO2 96%   BMI 35.49 kg/m    Physical Exam:  Physical Exam Vitals reviewed.  Cardiovascular:     Rate and Rhythm: Normal rate.  Pulmonary:     Effort: Pulmonary effort is normal.  Abdominal:     General: There is no distension.     Palpations: Abdomen is soft.     Tenderness: There is no abdominal tenderness.     Hernia: No hernia is present.    Assessment:  55 y.o. yo Male with umbilical hernia repair with mesh. Feeling really good.  Plan:  Ok to return to work with restrictions on 10/25/2020 of no lifting over 10 lbs. He can sweep and vacuum.  He can work without any restrictions starting 11/01/2020.  PRN follow up  Curlene Labrum, MD [facility] [address] [place], [place] [address] [phone] (office)

## 2022-12-06 LAB — COMPREHENSIVE METABOLIC PANEL WITH GFR: eGFR: 88

## 2022-12-06 LAB — HEMOGLOBIN A1C: Hemoglobin A1C: 8.1

## 2022-12-06 LAB — LIPID PANEL
LDL Cholesterol: 98
Triglycerides: 88 (ref 40–160)

## 2022-12-06 LAB — BASIC METABOLIC PANEL WITH GFR
BUN: 10 (ref 4–21)
Creatinine: 1.1 (ref 0.6–1.3)
Glucose: 205

## 2022-12-06 LAB — MICROALBUMIN / CREATININE URINE RATIO: Microalb Creat Ratio: 270

## 2023-03-08 LAB — HEMOGLOBIN A1C: Hemoglobin A1C: 8.8

## 2023-07-06 LAB — BASIC METABOLIC PANEL WITH GFR
BUN: 10 (ref 4–21)
Creatinine: 1 (ref 0.6–1.3)
Glucose: 192

## 2023-07-06 LAB — LIPID PANEL
LDL Cholesterol: 79
Triglycerides: 118 (ref 40–160)

## 2023-07-06 LAB — TSH: TSH: 3.08 (ref 0.41–5.90)

## 2023-07-06 LAB — MICROALBUMIN / CREATININE URINE RATIO: Microalb Creat Ratio: 236

## 2023-07-06 LAB — COMPREHENSIVE METABOLIC PANEL WITH GFR: eGFR: 84

## 2023-07-06 NOTE — Patient Instructions (Signed)

## 2023-07-11 ENCOUNTER — Ambulatory Visit: Payer: Self-pay | Admitting: Nurse Practitioner

## 2023-07-11 ENCOUNTER — Encounter: Payer: Self-pay | Admitting: Nurse Practitioner

## 2023-07-11 VITALS — BP 116/76 | HR 56 | Ht 73.5 in | Wt 266.8 lb

## 2023-07-11 DIAGNOSIS — E782 Mixed hyperlipidemia: Secondary | ICD-10-CM

## 2023-07-11 DIAGNOSIS — E1165 Type 2 diabetes mellitus with hyperglycemia: Secondary | ICD-10-CM | POA: Diagnosis not present

## 2023-07-11 DIAGNOSIS — Z7984 Long term (current) use of oral hypoglycemic drugs: Secondary | ICD-10-CM | POA: Diagnosis not present

## 2023-07-11 DIAGNOSIS — I10 Essential (primary) hypertension: Secondary | ICD-10-CM | POA: Diagnosis not present

## 2023-07-11 LAB — POCT GLYCOSYLATED HEMOGLOBIN (HGB A1C): Hemoglobin A1C: 9.5 % — AB (ref 4.0–5.6)

## 2023-07-11 MED ORDER — ACCU-CHEK SOFTCLIX LANCETS MISC: 12 refills | Status: AC

## 2023-07-11 MED ORDER — ACCU-CHEK GUIDE ME W/DEVICE KIT: PACK | 0 refills | Status: AC

## 2023-07-11 MED ORDER — ACCU-CHEK GUIDE TEST VI STRP: ORAL_STRIP | 12 refills | Status: AC

## 2023-07-11 MED ORDER — GLIMEPIRIDE 2 MG PO TABS: 2.0000 mg | ORAL_TABLET | Freq: Every day | ORAL | 1 refills | Status: AC

## 2023-07-11 MED ORDER — JANUMET 50-1000 MG PO TABS: 1.0000 | ORAL_TABLET | Freq: Two times a day (BID) | ORAL | 1 refills | Status: AC

## 2023-07-11 NOTE — Progress Notes (Signed)
 Endocrinology Consult Note       07/11/2023, 10:56 AM   Subjective:    Patient ID: Jesus Trevino, male    DOB: 1965-05-11.  Jesus Trevino is being seen in consultation for management of currently uncontrolled symptomatic diabetes requested by  Minus Amel, MD.   Past Medical History:  Diagnosis Date   Diabetes [facility])    Hypercholesterolemia    Hypertension    Sleep apnea     Past Surgical History:  Procedure Laterality Date   COLONOSCOPY WITH PROPOFOL  N/A 07/02/2020   Procedure: COLONOSCOPY WITH PROPOFOL ;  Surgeon: Vinetta Greening, DO;  Location: [facility];  Service: Endoscopy;  Laterality: N/A;  ASA I / II / AM procedure   POLYPECTOMY  07/02/2020   Procedure: POLYPECTOMY;  Surgeon: Vinetta Greening, DO;  Location: [facility];  Service: Endoscopy;;   UMBILICAL HERNIA REPAIR N/A 09/17/2020   Procedure: HERNIA REPAIR UMBILICAL ADULT W/MESH;  Surgeon: Awilda Bogus, MD;  Location: [facility] ORS;  Service: General;  Laterality: N/A;    Social History   Socioeconomic History   Marital status: Married    Spouse name: Not on file   Number of children: Not on file   Years of education: Not on file   Highest education level: Not on file  Occupational History   Not on file  Tobacco Use   Smoking status: Never   Smokeless tobacco: Never  Vaping Use   Vaping status: Never Used  Substance and Sexual Activity   Alcohol use: Never   Drug use: Never   Sexual activity: Not on file  Other Topics Concern   Not on file  Social History Narrative   Not on file   Social Drivers of Health   Financial Resource Strain: Not on file  Food Insecurity: Not on file  Transportation Needs: Not on file  Physical Activity: Not on file  Stress: Not on file  Social Connections: Not on file    Family History  Problem Relation Age of Onset   Asthma Mother    Alcohol abuse Father    Diabetes Sister      Outpatient Encounter Medications as of 07/11/2023  Medication Sig   Accu-Chek Softclix Lancets lancets Use as instructed to monitor glucose twice daily   Blood Glucose Monitoring Suppl (ACCU-CHEK GUIDE ME) w/Device KIT Use to check glucose twice daily   glucose blood (ACCU-CHEK GUIDE TEST) test strip Use as instructed to monitor glucose twice daily   olmesartan (BENICAR) 40 MG tablet Take 20 mg by mouth daily.   rosuvastatin (CRESTOR) 5 MG tablet Take 5 mg by mouth daily.   sitaGLIPtin-metformin (JANUMET) 50-1000 MG tablet Take 1 tablet by mouth 2 (two) times daily with a meal.   [DISCONTINUED] glimepiride (AMARYL) 2 MG tablet Take 2 mg by mouth 2 (two) times daily.   [DISCONTINUED] metFORMIN (GLUCOPHAGE) 1000 MG tablet Take 1,000 mg by mouth daily.   glimepiride (AMARYL) 2 MG tablet Take 1 tablet (2 mg total) by mouth daily with breakfast.   No facility-administered encounter medications on file as of 07/11/2023.    ALLERGIES: No Known Allergies  VACCINATION STATUS: Immunization History  Administered Date(s) Administered  PFIZER(Purple Top)SARS-COV-2 Vaccination 05/04/2019, 05/25/2019    Diabetes He presents for his initial diabetic visit. He has type 2 diabetes mellitus. Onset time: diagnosed at approx age of 19. His disease course has been fluctuating. Hypoglycemia symptoms include nervousness/anxiousness, sweats and tremors. There are no diabetic associated symptoms. There are no hypoglycemic complications. There are no diabetic complications. Risk factors for coronary artery disease include diabetes mellitus, dyslipidemia, family history, male sex, hypertension, stress and obesity. Current diabetic treatment includes oral agent (triple therapy). He is compliant with treatment most of the time. His weight is fluctuating minimally. He is following a generally unhealthy diet. When asked about meal planning, he reported none. He has not had a previous visit with a dietitian. He  participates in exercise intermittently. (He presents today for his consultation with no meter or logs to review.  He does not monitor glucose at home.  His most recent A1c, checked by his PCP on 5/9 was 9.5%, increasing from last A1c of 8.8%.  He drinks mostly soda and tea, will have coffee in colder weather.  He eats 2 meals per day (usually skipping lunch), and does not typically snack.  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  He is overdue for eye exam, has never seen podiatry in the past.) An ACE inhibitor/angiotensin II receptor blocker is being taken. He does not see a podiatrist.Eye exam is not current.     Review of systems  Constitutional: + Minimally fluctuating body weight, current Body mass index is 34.72 kg/m., no fatigue, no subjective hyperthermia, no subjective hypothermia Eyes: no blurry vision, no xerophthalmia ENT: no sore throat, no nodules palpated in throat, no dysphagia/odynophagia, no hoarseness Cardiovascular: no chest pain, no shortness of breath, no palpitations, no leg swelling Respiratory: no cough, no shortness of breath Gastrointestinal: no nausea/vomiting/diarrhea Musculoskeletal: no muscle/joint aches Skin: no rashes, no hyperemia Neurological: no tremors, no numbness, no tingling, no dizziness Psychiatric: no depression, no anxiety  Objective:      BP 116/76 (BP Location: Left Arm, Patient Position: Sitting, Cuff Size: Large)   Pulse (!) 56   Ht 6' 1.5" (1.867 m)   Wt 266 lb 12.8 oz (121 kg)   BMI 34.72 kg/m   Wt Readings from Last 3 Encounters:  07/11/23 266 lb 12.8 oz (121 kg)  10/19/20 269 lb (122 kg)  09/15/20 252 lb (114.3 kg)     BP Readings from Last 3 Encounters:  07/11/23 116/76  10/19/20 (!) 172/82  09/17/20 (!) 161/98     Physical Exam- Limited  Constitutional:  Body mass index is 34.72 kg/m. , not in acute distress, normal state of mind Eyes:  EOMI, no  exophthalmos Neck: Supple Cardiovascular: RRR, no murmurs, rubs, or gallops, no edema Respiratory: Adequate breathing efforts, no crackles, rales, rhonchi, or wheezing Musculoskeletal: no gross deformities, strength intact in all four extremities, no gross restriction of joint movements Skin:  no rashes, no hyperemia Neurological: no tremor with outstretched hands   Diabetic Foot Exam - Simple   No data filed      CMP ( most recent) CMP     Component Value Date/Time   NA 134 (L) 09/15/2020 1515   K 3.4 (L) 09/15/2020 1515   CL 100 09/15/2020 1515   CO2 27 09/15/2020 1515   GLUCOSE 94 09/15/2020 1515   BUN 10 07/06/2023 0000   CREATININE 1.0 07/06/2023 0000   CREATININE 0.90 09/15/2020 1515   CALCIUM 8.5 (L)  09/15/2020 1515   EGFR 84 07/06/2023 0000   GFRNONAA >60 09/15/2020 1515     Diabetic Labs (most recent): Lab Results  Component Value Date   HGBA1C 9.5 (A) 07/11/2023   HGBA1C 8.8 03/08/2023   HGBA1C 8.1 12/06/2022     Lipid Panel ( most recent) Lipid Panel     Component Value Date/Time   TRIG 118 07/06/2023 0000   LDLCALC 79 07/06/2023 0000      Lab Results  Component Value Date   TSH 3.08 07/06/2023           Assessment & Plan:   1) Type 2 diabetes mellitus with hyperglycemia, without long-term current use of insulin (HCC) (Primary)  He presents today for his consultation with no meter or logs to review.  He does not monitor glucose at home.  His most recent A1c, checked by his PCP on 5/9 was 9.5%, increasing from last A1c of 8.8%.  He drinks mostly soda and tea, will have coffee in colder weather.  He eats 2 meals per day (usually skipping lunch), and does not typically snack.  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  He is overdue for eye exam, has never seen podiatry in the past.  - Jesus Trevino has currently uncontrolled symptomatic type 2 DM since 58 years of age, with most  recent A1c of 9.5 %.   -Recent labs reviewed.  - I had a long discussion with him about the progressive nature of diabetes and the pathology behind its complications. 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 These are all discussed in detail with him.  The following Lifestyle Medicine recommendations according to American College of Lifestyle Medicine [facility]) were discussed and offered to patient and he agrees to start the journey:  A. Whole Foods, Plant-based plate comprising of fruits and vegetables, plant-based proteins, whole-grain carbohydrates was discussed in detail with the patient.   A list for source of those nutrients were also provided to the patient.  Patient will use only water or unsweetened tea for hydration. B.  The need to stay away from risky substances including alcohol, smoking; obtaining 7 to 9 hours of restorative sleep, at least 150 minutes of moderate intensity exercise weekly, the importance of healthy social connections,  and stress reduction techniques were discussed. C.  A full color page of  Calorie density of various food groups per pound showing examples of each food groups was provided to the patient.  - I have counseled him on diet and weight management by adopting a carbohydrate restricted/protein rich diet. Patient is encouraged to switch to unprocessed or minimally processed complex starch and increased protein intake (animal or plant source), fruits, and vegetables. -  he is advised to stick to a routine mealtimes to eat 3 meals a day and avoid unnecessary snacks (to snack only to correct hypoglycemia).   - he acknowledges that there is a room for improvement in his food and drink choices. - Suggestion is made for him to avoid simple carbohydrates from his diet including Cakes, Sweet Desserts, Ice Cream, Soda (diet and regular), Sweet  Tea, Candies, Chips, Cookies, Store Bought Juices, Alcohol in Excess of 1-2 drinks a day, Artificial Sweeteners, Coffee Creamer, and "Sugar-free" Products. This will help patient to have more stable blood glucose profile and potentially avoid unintended  weight gain.  - I have approached him with the following individualized plan to manage his diabetes and patient agrees:   -He is advised to adjust his Janumet to 50/1000 mg twice daily after meals (has only been taking it once daily).  He can continue Glimepiride 2 mg po once daily with breakfast.  He will do best to start limiting and eventually cut out sugary drinks.  -he is encouraged to start/continue monitoring glucose 2 times daily, before breakfast and before bed, to log their readings on the clinic sheets provided, and bring them to review at follow up appointment in 3 months.  - Adjustment parameters are given to him for hypo and hyperglycemia in writing. - he is encouraged to call clinic for blood glucose levels less than 70 or above 300 mg /dl.  - Specific targets for  A1c; LDL, HDL, and Triglycerides were discussed with the patient.  2) Blood Pressure /Hypertension:  his blood pressure is controlled to target.   he is advised to continue his current medications as prescribed by PCP.  3) Lipids/Hyperlipidemia:    Review of his recent lipid panel from 07/06/23 showed controlled LDL at 79 .  he is advised to continue Crestor 5 mg daily at bedtime.  Side effects and precautions discussed with him.  4)  Weight/Diet:  his Body mass index is 34.72 kg/m.  -  clearly complicating his diabetes care.   he is a candidate for weight loss. I discussed with him the fact that loss of 5 - 10% of his  current body weight will have the most impact on his diabetes management.  Exercise, and detailed carbohydrates information provided  -  detailed on discharge instructions.  5) Chronic Care/Health Maintenance: -he is on ACEI/ARB and Statin medications  and is encouraged to initiate and continue to follow up with Ophthalmology, Dentist, Podiatrist at least yearly or according to recommendations, and advised to stay away from smoking. I have recommended yearly flu vaccine and pneumonia vaccine at least every 5 years; moderate intensity exercise for up to 150 minutes weekly; and sleep for at least 7 hours a day.  - he is advised to maintain close follow up with Minus Amel, MD for primary care needs, as well as his other providers for optimal and coordinated care.   - Time spent in this patient care: 60 min, which was spent in counseling him about his diabetes and the rest reviewing his blood glucose logs, discussing his hypoglycemia and hyperglycemia episodes, reviewing his current and previous labs/studies (including abstraction from other facilities) and medications doses and developing a long term treatment plan based on the latest standards of care/guidelines; and documenting his care.    Please refer to Patient Instructions for Blood Glucose Monitoring and Insulin/Medications Dosing Guide" in media tab for additional information. Please also refer to "Patient Self Inventory" in the Media tab for reviewed elements of pertinent patient history.  Jesus Trevino participated in the discussions, expressed understanding, and voiced agreement with the above plans.  All questions were answered to his satisfaction. he is encouraged to contact clinic should he have any questions or concerns prior to his return visit.     Follow up plan: - Return in about 3 months (around 10/11/2023) for Diabetes F/U with A1c in office, No previsit labs, Bring meter and logs.    Jesus Trevino, [facility] [address] [place], [place] [address] Phone: [phone] Fax: [phone]  07/11/2023, 10:56 AM

## 2023-10-11 ENCOUNTER — Ambulatory Visit: Admitting: Nurse Practitioner

## 2023-10-11 DIAGNOSIS — E782 Mixed hyperlipidemia: Secondary | ICD-10-CM

## 2023-10-11 DIAGNOSIS — E1165 Type 2 diabetes mellitus with hyperglycemia: Secondary | ICD-10-CM

## 2023-10-11 DIAGNOSIS — I10 Essential (primary) hypertension: Secondary | ICD-10-CM

## 2023-10-11 DIAGNOSIS — Z7984 Long term (current) use of oral hypoglycemic drugs: Secondary | ICD-10-CM

## 2024-03-03 ENCOUNTER — Telehealth: Payer: Self-pay | Admitting: Nurse Practitioner

## 2024-03-03 NOTE — Telephone Encounter (Signed)
 Those will come from his PCP, I do not even have record of him being on Amlodipine or Losartan.

## 2024-03-03 NOTE — Telephone Encounter (Signed)
 Pt is requesting refills on his Amlodipine, Losartan, Crestor [facility] [place].

## 2024-03-05 ENCOUNTER — Telehealth: Payer: Self-pay | Admitting: *Deleted

## 2024-03-05 NOTE — Telephone Encounter (Signed)
 That would need to come from his PCP.  I do not have record of the BP meds he is even needing.  There is a previous telephone note from Brittany.

## 2024-03-05 NOTE — Telephone Encounter (Signed)
 Patient left a message asking if his blood pressure medication could be refilled. He was last seen here 07/11/23 and has made appointment for 06/05/24.

## 2024-03-05 NOTE — Telephone Encounter (Signed)
 Noted and see where the patient has been sent a message in MyChart explaining why Jesus Trevino cannot refill his medication.

## 2024-03-07 ENCOUNTER — Telehealth: Payer: Self-pay | Admitting: *Deleted

## 2024-03-07 NOTE — Telephone Encounter (Signed)
 Patient has left another message asking if we would fill his blood pressure medications. Patient was sent a message in MyChart by [place] sharing that those medications would have to come from his PCP. Patient called and made aware of this and he was advised to get a PCP or he could go to Urgent Care and see if they would fill these medications. Patient was called and a voicemail was left with Whitney's recommendation.  Patient was seen here 07/11/2023 , No Show on follow up appointment 10/11/2023. Currently, the patient has a follow up visit 06/05/2024.

## 2024-06-05 ENCOUNTER — Ambulatory Visit: Admitting: Nurse Practitioner
